# Patient Record
Sex: Female | Born: 1935
Health system: Southern US, Community
[De-identification: ages and names within clinical notes are randomized; demographics above are authoritative.]

## PROBLEM LIST (undated history)

## (undated) DIAGNOSIS — M1990 Unspecified osteoarthritis, unspecified site: Secondary | ICD-10-CM

## (undated) DIAGNOSIS — K219 Gastro-esophageal reflux disease without esophagitis: Secondary | ICD-10-CM

## (undated) HISTORY — PX: TONSILLECTOMY: SUR1361

## (undated) HISTORY — PX: OOPHORECTOMY: SHX86

## (undated) HISTORY — PX: CHOLECYSTECTOMY: SHX55

## (undated) HISTORY — PX: ABDOMINAL HYSTERECTOMY: SHX81

---

## 2002-10-21 ENCOUNTER — Other Ambulatory Visit: Admission: RE | Admit: 2002-10-21 | Discharge: 2002-10-21 | Payer: Self-pay | Admitting: Obstetrics & Gynecology

## 2004-11-18 ENCOUNTER — Ambulatory Visit: Payer: Self-pay

## 2004-12-06 ENCOUNTER — Other Ambulatory Visit: Admission: RE | Admit: 2004-12-06 | Discharge: 2004-12-06 | Payer: Self-pay | Admitting: Obstetrics & Gynecology

## 2004-12-07 ENCOUNTER — Ambulatory Visit: Payer: Self-pay

## 2004-12-14 ENCOUNTER — Encounter: Admission: RE | Admit: 2004-12-14 | Discharge: 2004-12-14 | Payer: Self-pay | Admitting: Obstetrics & Gynecology

## 2004-12-21 ENCOUNTER — Ambulatory Visit: Payer: Self-pay

## 2007-12-11 ENCOUNTER — Ambulatory Visit: Payer: Self-pay | Admitting: Internal Medicine

## 2007-12-25 ENCOUNTER — Other Ambulatory Visit: Payer: Self-pay

## 2007-12-25 ENCOUNTER — Ambulatory Visit: Payer: Self-pay | Admitting: Surgery

## 2008-01-07 ENCOUNTER — Ambulatory Visit: Payer: Self-pay | Admitting: Surgery

## 2008-08-28 ENCOUNTER — Ambulatory Visit: Payer: Self-pay | Admitting: Gastroenterology

## 2010-08-07 ENCOUNTER — Encounter: Payer: Self-pay | Admitting: Obstetrics & Gynecology

## 2011-03-28 ENCOUNTER — Other Ambulatory Visit: Payer: Self-pay | Admitting: Gastroenterology

## 2011-03-28 DIAGNOSIS — I498 Other specified cardiac arrhythmias: Secondary | ICD-10-CM

## 2011-06-27 ENCOUNTER — Ambulatory Visit: Payer: Self-pay | Admitting: Gastroenterology

## 2011-06-29 LAB — PATHOLOGY REPORT

## 2011-12-13 ENCOUNTER — Ambulatory Visit: Payer: Self-pay | Admitting: Internal Medicine

## 2012-01-03 ENCOUNTER — Ambulatory Visit: Payer: Self-pay | Admitting: Internal Medicine

## 2013-02-11 ENCOUNTER — Ambulatory Visit: Payer: Self-pay | Admitting: Internal Medicine

## 2013-02-13 ENCOUNTER — Ambulatory Visit: Payer: Self-pay | Admitting: Internal Medicine

## 2014-02-16 ENCOUNTER — Ambulatory Visit: Payer: Self-pay | Admitting: Family Medicine

## 2014-10-16 DIAGNOSIS — H269 Unspecified cataract: Secondary | ICD-10-CM | POA: Diagnosis not present

## 2014-11-06 DIAGNOSIS — E782 Mixed hyperlipidemia: Secondary | ICD-10-CM | POA: Diagnosis not present

## 2014-11-06 DIAGNOSIS — E039 Hypothyroidism, unspecified: Secondary | ICD-10-CM | POA: Diagnosis not present

## 2014-11-06 DIAGNOSIS — Z79899 Other long term (current) drug therapy: Secondary | ICD-10-CM | POA: Diagnosis not present

## 2014-11-06 DIAGNOSIS — R7301 Impaired fasting glucose: Secondary | ICD-10-CM | POA: Diagnosis not present

## 2014-11-16 DIAGNOSIS — E039 Hypothyroidism, unspecified: Secondary | ICD-10-CM | POA: Diagnosis not present

## 2014-11-16 DIAGNOSIS — R739 Hyperglycemia, unspecified: Secondary | ICD-10-CM | POA: Diagnosis not present

## 2014-11-16 DIAGNOSIS — Z0000 Encounter for general adult medical examination without abnormal findings: Secondary | ICD-10-CM | POA: Diagnosis not present

## 2015-02-02 DIAGNOSIS — H6501 Acute serous otitis media, right ear: Secondary | ICD-10-CM | POA: Diagnosis not present

## 2015-02-17 ENCOUNTER — Other Ambulatory Visit: Payer: Self-pay | Admitting: Family Medicine

## 2015-02-17 DIAGNOSIS — Z1231 Encounter for screening mammogram for malignant neoplasm of breast: Secondary | ICD-10-CM

## 2015-02-25 DIAGNOSIS — H6980 Other specified disorders of Eustachian tube, unspecified ear: Secondary | ICD-10-CM | POA: Diagnosis not present

## 2015-02-25 DIAGNOSIS — H903 Sensorineural hearing loss, bilateral: Secondary | ICD-10-CM | POA: Diagnosis not present

## 2015-02-25 DIAGNOSIS — H9201 Otalgia, right ear: Secondary | ICD-10-CM | POA: Diagnosis not present

## 2015-03-18 ENCOUNTER — Other Ambulatory Visit: Payer: Self-pay | Admitting: Family Medicine

## 2015-03-18 ENCOUNTER — Ambulatory Visit
Admission: RE | Admit: 2015-03-18 | Discharge: 2015-03-18 | Disposition: A | Discharge status: Home / Self Care | Payer: Commercial Managed Care - HMO | Source: Ambulatory Visit | Attending: Family Medicine | Admitting: Family Medicine

## 2015-03-18 DIAGNOSIS — Z1231 Encounter for screening mammogram for malignant neoplasm of breast: Secondary | ICD-10-CM | POA: Insufficient documentation

## 2015-04-20 DIAGNOSIS — J069 Acute upper respiratory infection, unspecified: Secondary | ICD-10-CM | POA: Diagnosis not present

## 2015-05-10 DIAGNOSIS — E781 Pure hyperglyceridemia: Secondary | ICD-10-CM | POA: Diagnosis not present

## 2015-05-10 DIAGNOSIS — Z79899 Other long term (current) drug therapy: Secondary | ICD-10-CM | POA: Diagnosis not present

## 2015-05-10 DIAGNOSIS — R739 Hyperglycemia, unspecified: Secondary | ICD-10-CM | POA: Diagnosis not present

## 2015-05-10 DIAGNOSIS — E038 Other specified hypothyroidism: Secondary | ICD-10-CM | POA: Diagnosis not present

## 2015-05-20 DIAGNOSIS — Z79899 Other long term (current) drug therapy: Secondary | ICD-10-CM | POA: Diagnosis not present

## 2015-05-20 DIAGNOSIS — E039 Hypothyroidism, unspecified: Secondary | ICD-10-CM | POA: Diagnosis not present

## 2015-05-20 DIAGNOSIS — I10 Essential (primary) hypertension: Secondary | ICD-10-CM | POA: Diagnosis not present

## 2015-05-20 DIAGNOSIS — R739 Hyperglycemia, unspecified: Secondary | ICD-10-CM | POA: Diagnosis not present

## 2015-05-20 DIAGNOSIS — K219 Gastro-esophageal reflux disease without esophagitis: Secondary | ICD-10-CM | POA: Diagnosis not present

## 2015-05-20 DIAGNOSIS — E782 Mixed hyperlipidemia: Secondary | ICD-10-CM | POA: Diagnosis not present

## 2015-06-02 DIAGNOSIS — L570 Actinic keratosis: Secondary | ICD-10-CM | POA: Diagnosis not present

## 2015-06-02 DIAGNOSIS — L239 Allergic contact dermatitis, unspecified cause: Secondary | ICD-10-CM | POA: Diagnosis not present

## 2015-06-16 DIAGNOSIS — L232 Allergic contact dermatitis due to cosmetics: Secondary | ICD-10-CM | POA: Diagnosis not present

## 2015-09-14 DIAGNOSIS — M792 Neuralgia and neuritis, unspecified: Secondary | ICD-10-CM | POA: Diagnosis not present

## 2015-12-02 DIAGNOSIS — K045 Chronic apical periodontitis: Secondary | ICD-10-CM | POA: Diagnosis not present

## 2015-12-02 DIAGNOSIS — J338 Other polyp of sinus: Secondary | ICD-10-CM | POA: Diagnosis not present

## 2015-12-14 DIAGNOSIS — B9689 Other specified bacterial agents as the cause of diseases classified elsewhere: Secondary | ICD-10-CM | POA: Diagnosis not present

## 2015-12-14 DIAGNOSIS — J019 Acute sinusitis, unspecified: Secondary | ICD-10-CM | POA: Diagnosis not present

## 2015-12-16 DIAGNOSIS — Z79899 Other long term (current) drug therapy: Secondary | ICD-10-CM | POA: Diagnosis not present

## 2015-12-16 DIAGNOSIS — R739 Hyperglycemia, unspecified: Secondary | ICD-10-CM | POA: Diagnosis not present

## 2015-12-16 DIAGNOSIS — E039 Hypothyroidism, unspecified: Secondary | ICD-10-CM | POA: Diagnosis not present

## 2015-12-16 DIAGNOSIS — E782 Mixed hyperlipidemia: Secondary | ICD-10-CM | POA: Diagnosis not present

## 2015-12-23 DIAGNOSIS — E039 Hypothyroidism, unspecified: Secondary | ICD-10-CM | POA: Diagnosis not present

## 2015-12-23 DIAGNOSIS — Z79899 Other long term (current) drug therapy: Secondary | ICD-10-CM | POA: Diagnosis not present

## 2015-12-23 DIAGNOSIS — Z0000 Encounter for general adult medical examination without abnormal findings: Secondary | ICD-10-CM | POA: Diagnosis not present

## 2015-12-23 DIAGNOSIS — R739 Hyperglycemia, unspecified: Secondary | ICD-10-CM | POA: Diagnosis not present

## 2015-12-23 DIAGNOSIS — I10 Essential (primary) hypertension: Secondary | ICD-10-CM | POA: Diagnosis not present

## 2016-02-07 DIAGNOSIS — H2513 Age-related nuclear cataract, bilateral: Secondary | ICD-10-CM | POA: Diagnosis not present

## 2016-02-10 ENCOUNTER — Other Ambulatory Visit: Payer: Self-pay | Admitting: Family Medicine

## 2016-02-10 DIAGNOSIS — Z1231 Encounter for screening mammogram for malignant neoplasm of breast: Secondary | ICD-10-CM

## 2016-03-21 ENCOUNTER — Ambulatory Visit
Admission: RE | Admit: 2016-03-21 | Discharge: 2016-03-21 | Disposition: A | Discharge status: Home / Self Care | Payer: Commercial Managed Care - HMO | Source: Ambulatory Visit | Attending: Family Medicine | Admitting: Family Medicine

## 2016-03-21 ENCOUNTER — Other Ambulatory Visit: Payer: Self-pay | Admitting: Family Medicine

## 2016-03-21 DIAGNOSIS — Z1231 Encounter for screening mammogram for malignant neoplasm of breast: Secondary | ICD-10-CM | POA: Diagnosis not present

## 2016-03-31 DIAGNOSIS — J019 Acute sinusitis, unspecified: Secondary | ICD-10-CM | POA: Diagnosis not present

## 2016-04-06 DIAGNOSIS — L040 Acute lymphadenitis of face, head and neck: Secondary | ICD-10-CM | POA: Diagnosis not present

## 2016-06-16 DIAGNOSIS — E039 Hypothyroidism, unspecified: Secondary | ICD-10-CM | POA: Diagnosis not present

## 2016-06-16 DIAGNOSIS — Z79899 Other long term (current) drug therapy: Secondary | ICD-10-CM | POA: Diagnosis not present

## 2016-06-16 DIAGNOSIS — R739 Hyperglycemia, unspecified: Secondary | ICD-10-CM | POA: Diagnosis not present

## 2016-06-16 DIAGNOSIS — I10 Essential (primary) hypertension: Secondary | ICD-10-CM | POA: Diagnosis not present

## 2016-06-23 DIAGNOSIS — G8929 Other chronic pain: Secondary | ICD-10-CM | POA: Diagnosis not present

## 2016-06-23 DIAGNOSIS — I10 Essential (primary) hypertension: Secondary | ICD-10-CM | POA: Diagnosis not present

## 2016-06-23 DIAGNOSIS — Z79899 Other long term (current) drug therapy: Secondary | ICD-10-CM | POA: Diagnosis not present

## 2016-06-23 DIAGNOSIS — E7800 Pure hypercholesterolemia, unspecified: Secondary | ICD-10-CM | POA: Diagnosis not present

## 2016-06-23 DIAGNOSIS — M25562 Pain in left knee: Secondary | ICD-10-CM | POA: Diagnosis not present

## 2016-06-23 DIAGNOSIS — R739 Hyperglycemia, unspecified: Secondary | ICD-10-CM | POA: Diagnosis not present

## 2016-06-23 DIAGNOSIS — E039 Hypothyroidism, unspecified: Secondary | ICD-10-CM | POA: Diagnosis not present

## 2016-12-26 DIAGNOSIS — E7800 Pure hypercholesterolemia, unspecified: Secondary | ICD-10-CM | POA: Diagnosis not present

## 2016-12-26 DIAGNOSIS — Z79899 Other long term (current) drug therapy: Secondary | ICD-10-CM | POA: Diagnosis not present

## 2016-12-26 DIAGNOSIS — R739 Hyperglycemia, unspecified: Secondary | ICD-10-CM | POA: Diagnosis not present

## 2016-12-26 DIAGNOSIS — E039 Hypothyroidism, unspecified: Secondary | ICD-10-CM | POA: Diagnosis not present

## 2017-01-02 DIAGNOSIS — Z0000 Encounter for general adult medical examination without abnormal findings: Secondary | ICD-10-CM | POA: Diagnosis not present

## 2017-01-02 DIAGNOSIS — Z79899 Other long term (current) drug therapy: Secondary | ICD-10-CM | POA: Diagnosis not present

## 2017-01-02 DIAGNOSIS — R739 Hyperglycemia, unspecified: Secondary | ICD-10-CM | POA: Diagnosis not present

## 2017-01-02 DIAGNOSIS — E7800 Pure hypercholesterolemia, unspecified: Secondary | ICD-10-CM | POA: Diagnosis not present

## 2017-02-12 ENCOUNTER — Other Ambulatory Visit: Payer: Self-pay | Admitting: Family Medicine

## 2017-02-12 DIAGNOSIS — Z1231 Encounter for screening mammogram for malignant neoplasm of breast: Secondary | ICD-10-CM

## 2017-03-12 DIAGNOSIS — H2513 Age-related nuclear cataract, bilateral: Secondary | ICD-10-CM | POA: Diagnosis not present

## 2017-03-29 ENCOUNTER — Ambulatory Visit
Admission: RE | Admit: 2017-03-29 | Discharge: 2017-03-29 | Disposition: A | Discharge status: Home / Self Care | Payer: Commercial Managed Care - HMO | Source: Ambulatory Visit | Attending: Family Medicine | Admitting: Family Medicine

## 2017-03-29 DIAGNOSIS — Z1231 Encounter for screening mammogram for malignant neoplasm of breast: Secondary | ICD-10-CM | POA: Diagnosis not present

## 2017-06-12 DIAGNOSIS — H903 Sensorineural hearing loss, bilateral: Secondary | ICD-10-CM | POA: Diagnosis not present

## 2017-06-12 DIAGNOSIS — H6122 Impacted cerumen, left ear: Secondary | ICD-10-CM | POA: Diagnosis not present

## 2017-06-28 DIAGNOSIS — E7800 Pure hypercholesterolemia, unspecified: Secondary | ICD-10-CM | POA: Diagnosis not present

## 2017-06-28 DIAGNOSIS — Z79899 Other long term (current) drug therapy: Secondary | ICD-10-CM | POA: Diagnosis not present

## 2017-06-28 DIAGNOSIS — E039 Hypothyroidism, unspecified: Secondary | ICD-10-CM | POA: Diagnosis not present

## 2017-06-28 DIAGNOSIS — R739 Hyperglycemia, unspecified: Secondary | ICD-10-CM | POA: Diagnosis not present

## 2017-07-16 DIAGNOSIS — E7800 Pure hypercholesterolemia, unspecified: Secondary | ICD-10-CM | POA: Diagnosis not present

## 2017-07-16 DIAGNOSIS — E039 Hypothyroidism, unspecified: Secondary | ICD-10-CM | POA: Diagnosis not present

## 2017-07-16 DIAGNOSIS — R739 Hyperglycemia, unspecified: Secondary | ICD-10-CM | POA: Diagnosis not present

## 2017-07-16 DIAGNOSIS — Z79899 Other long term (current) drug therapy: Secondary | ICD-10-CM | POA: Diagnosis not present

## 2017-11-22 DIAGNOSIS — L0201 Cutaneous abscess of face: Secondary | ICD-10-CM | POA: Diagnosis not present

## 2017-12-27 DIAGNOSIS — L989 Disorder of the skin and subcutaneous tissue, unspecified: Secondary | ICD-10-CM | POA: Diagnosis not present

## 2017-12-31 DIAGNOSIS — L820 Inflamed seborrheic keratosis: Secondary | ICD-10-CM | POA: Diagnosis not present

## 2017-12-31 DIAGNOSIS — L821 Other seborrheic keratosis: Secondary | ICD-10-CM | POA: Diagnosis not present

## 2017-12-31 DIAGNOSIS — L578 Other skin changes due to chronic exposure to nonionizing radiation: Secondary | ICD-10-CM | POA: Diagnosis not present

## 2018-01-10 DIAGNOSIS — E7800 Pure hypercholesterolemia, unspecified: Secondary | ICD-10-CM | POA: Diagnosis not present

## 2018-01-10 DIAGNOSIS — E039 Hypothyroidism, unspecified: Secondary | ICD-10-CM | POA: Diagnosis not present

## 2018-01-10 DIAGNOSIS — Z79899 Other long term (current) drug therapy: Secondary | ICD-10-CM | POA: Diagnosis not present

## 2018-01-10 DIAGNOSIS — R739 Hyperglycemia, unspecified: Secondary | ICD-10-CM | POA: Diagnosis not present

## 2018-01-16 DIAGNOSIS — M25562 Pain in left knee: Secondary | ICD-10-CM | POA: Diagnosis not present

## 2018-01-29 DIAGNOSIS — Z79899 Other long term (current) drug therapy: Secondary | ICD-10-CM | POA: Diagnosis not present

## 2018-01-29 DIAGNOSIS — E039 Hypothyroidism, unspecified: Secondary | ICD-10-CM | POA: Diagnosis not present

## 2018-01-29 DIAGNOSIS — E782 Mixed hyperlipidemia: Secondary | ICD-10-CM | POA: Diagnosis not present

## 2018-01-29 DIAGNOSIS — R739 Hyperglycemia, unspecified: Secondary | ICD-10-CM | POA: Diagnosis not present

## 2018-01-29 DIAGNOSIS — Z0000 Encounter for general adult medical examination without abnormal findings: Secondary | ICD-10-CM | POA: Diagnosis not present

## 2018-02-07 DIAGNOSIS — M25562 Pain in left knee: Secondary | ICD-10-CM | POA: Diagnosis not present

## 2018-02-07 DIAGNOSIS — M1712 Unilateral primary osteoarthritis, left knee: Secondary | ICD-10-CM | POA: Diagnosis not present

## 2018-02-11 ENCOUNTER — Emergency Department
Admission: EM | Admit: 2018-02-11 | Discharge: 2018-02-11 | Disposition: A | Discharge status: Home / Self Care | Payer: Medicare HMO | Attending: Student in an Organized Health Care Education/Training Program | Admitting: Student in an Organized Health Care Education/Training Program

## 2018-02-11 ENCOUNTER — Emergency Department: Payer: Medicare HMO

## 2018-02-11 ENCOUNTER — Other Ambulatory Visit: Payer: Self-pay

## 2018-02-11 DIAGNOSIS — R0789 Other chest pain: Secondary | ICD-10-CM | POA: Insufficient documentation

## 2018-02-11 DIAGNOSIS — R072 Precordial pain: Secondary | ICD-10-CM | POA: Diagnosis not present

## 2018-02-11 DIAGNOSIS — R079 Chest pain, unspecified: Secondary | ICD-10-CM | POA: Diagnosis not present

## 2018-02-11 DIAGNOSIS — I700 Atherosclerosis of aorta: Secondary | ICD-10-CM | POA: Diagnosis not present

## 2018-02-11 LAB — TROPONIN I
Troponin I: <0.03 ng/mL (ref <0.03)
Troponin I: 0.03 ng/mL (ref <0.03)

## 2018-02-11 LAB — CBC WITH DIFFERENTIAL/PLATELET
BASOS ABS: 0.1 10*3/uL (ref 0–0.1)
Basophils Relative: 1 %
EOS PCT: 3 %
Eosinophils Absolute: 0.3 10*3/uL (ref 0–0.7)
HEMATOCRIT: 47.2 % — AB (ref 35.0–47.0)
Hemoglobin: 15.8 g/dL (ref 12.0–16.0)
LYMPHS PCT: 29 %
Lymphs Abs: 3.7 10*3/uL — ABNORMAL HIGH (ref 1.0–3.6)
MCH: 28.2 pg (ref 26.0–34.0)
MCHC: 33.5 g/dL (ref 32.0–36.0)
MCV: 84 fL (ref 80.0–100.0)
MONO ABS: 1 10*3/uL — AB (ref 0.2–0.9)
MONOS PCT: 8 %
NEUTROS ABS: 7.9 10*3/uL — AB (ref 1.4–6.5)
Neutrophils Relative %: 59 %
PLATELETS: 210 10*3/uL (ref 150–440)
RBC: 5.61 MIL/uL — ABNORMAL HIGH (ref 3.80–5.20)
RDW: 14.4 % (ref 11.5–14.5)
WBC: 13.1 10*3/uL — ABNORMAL HIGH (ref 3.6–11.0)

## 2018-02-11 LAB — COMPREHENSIVE METABOLIC PANEL
ALT: 32 U/L (ref 0–44)
ANION GAP: 6 (ref 5–15)
AST: 24 U/L (ref 15–41)
Albumin: 4.2 g/dL (ref 3.5–5.0)
Alkaline Phosphatase: 47 U/L (ref 38–126)
BUN: 23 mg/dL (ref 8–23)
CHLORIDE: 104 mmol/L (ref 98–111)
CO2: 31 mmol/L (ref 22–32)
Calcium: 9.4 mg/dL (ref 8.9–10.3)
Creatinine, Ser: 0.91 mg/dL (ref 0.44–1.00)
GFR calc non Af Amer: 57 mL/min — ABNORMAL LOW (ref >60)
GLUCOSE: 132 mg/dL — AB (ref 70–99)
POTASSIUM: 5.2 mmol/L — AB (ref 3.5–5.1)
Sodium: 141 mmol/L (ref 135–145)
Total Bilirubin: 0.3 mg/dL (ref 0.3–1.2)
Total Protein: 7.1 g/dL (ref 6.5–8.1)

## 2018-02-11 MED ORDER — GI COCKTAIL ~~LOC~~
30.0000 mL | Freq: Once | ORAL | Status: AC
  Administered 2018-02-11: 30 mL via ORAL
  Filled 2018-02-11: qty 30

## 2018-02-11 MED ORDER — AMLODIPINE BESYLATE 5 MG PO TABS
5.0000 mg | ORAL_TABLET | Freq: Once | ORAL | Status: AC
  Administered 2018-02-11: 5 mg via ORAL
  Filled 2018-02-11: qty 1

## 2018-02-11 MED ORDER — IOPAMIDOL (ISOVUE-370) INJECTION 76%
75.0000 mL | Freq: Once | INTRAVENOUS | Status: AC | PRN
  Administered 2018-02-11: 75 mL via INTRAVENOUS

## 2018-02-11 NOTE — Discharge Instructions (Addendum)

## 2018-02-11 NOTE — ED Triage Notes (Signed)
Pt arrives for chest pain started around 1 hour pt states pain stopped after asprin   220/100 87  97 RA NKA

## 2018-02-11 NOTE — ED Provider Notes (Signed)
-----------------------------------------   6:22 PM on 02/11/2018 -----------------------------------------   Blood pressure 135/75, pulse 67, temperature 98.3 F (36.8 C), temperature source Oral, resp. rate 16, height 5\' 2"  (1.575 m), weight 69.9 kg (154 lb), SpO2 97 %.  Assuming care from Dr. Quentin Cornwall of [facility] is a 84 y.o. female with a chief complaint of Chest Pain .    Please refer to H&P by previous MD for further details.  The current plan of care is to f/u results of repeat troponin and CTA.   I have personally reviewed the images performed during this visit and I agree with the Radiologist's read.   Interpretation by Radiologist:  Dg Chest Portable 1 View  Result Date: 02/11/2018 CLINICAL DATA:  Chest pain EXAM: PORTABLE CHEST 1 VIEW COMPARISON:  12/25/2007 FINDINGS: Normal heart size and mediastinal contours. There is no edema, consolidation, effusion, or pneumothorax. Artifact from EKG leads. IMPRESSION: Negative portable chest. Electronically Signed   By: Monte Fantasia M.D.   On: 02/11/2018 14:03   Ct Angio Chest Aorta W And/or Wo Contrast  Result Date: 02/11/2018 CLINICAL DATA:  Chest pain. EXAM: CT ANGIOGRAPHY CHEST WITH CONTRAST TECHNIQUE: Multidetector CT imaging of the chest was performed using the standard protocol during bolus administration of intravenous contrast. Multiplanar CT image reconstructions and MIPs were obtained to evaluate the vascular anatomy. CONTRAST:  37mL ISOVUE-370 IOPAMIDOL (ISOVUE-370) INJECTION 76% COMPARISON:  Radiograph of same day. FINDINGS: Cardiovascular: Atherosclerosis of thoracic aorta is noted without aneurysm or dissection. Great vessels are widely patent without significant stenosis. Normal cardiac size. No pericardial effusion. Mediastinum/Nodes: Small sliding-type hiatal hernia. No adenopathy is noted. Lungs/Pleura: No pneumothorax or pleural effusion is noted. Minimal bibasilar subsegmental atelectasis or scarring is noted.  Upper Abdomen: No acute abnormality. Musculoskeletal: No chest wall abnormality. No acute or significant osseous findings. Review of the MIP images confirms the above findings. IMPRESSION: No evidence of thoracic aortic aneurysm or dissection. Small sliding-type hiatal hernia. Minimal bibasilar subsegmental atelectasis or scarring is noted. Aortic Atherosclerosis (ICD10-I70.0). Electronically Signed   By: Marijo Conception, M.D.   On: 02/11/2018 15:49     Repeat troponin q4hrs with no significant elevation. Patient remains pain free. CT negative for any acute findings.  Recommend close follow-up with primary care doctor and return to the emergency room for new or worsening chest pain, shortness of breath, or dizziness.        Alfred Levins, [place], MD 02/11/18 [phone]

## 2018-02-11 NOTE — ED Notes (Signed)
Date and time results received: 02/11/18 6:02 PM  Test: Troponin I  Critical Value: 0.03  Name of Provider Notified: Dr. Alfred Levins  Orders Received? Or Actions Taken?: no new orders at this time

## 2018-02-11 NOTE — ED Provider Notes (Signed)
[facility] Emergency Department Provider Note    First MD Initiated Contact with Patient 02/11/18 1338     (approximate)  I have reviewed the triage vital signs and the nursing notes.   HISTORY  Chief Complaint Chest Pain    HPI Paula Joyce is a 84 y.o. female presents the ER with chief complaint of midsternal chest pain radiating bilateral neck that started around 1230 and woke her from sleep while she was laying down after eating dinner.  States the symptoms lasted roughly 30 minutes and improved after she took an aspirin.  Denies any shortness of breath or diaphoresis.  No pain ripping or tearing through to her back.  Denies any nausea or vomiting.  Does have significant history of reflux.  Not currently taking any antiacid medication is not currently on any antihypertensive medications.  States that she is asymptomatic and completely fine at this point.  Denies any numbness or tingling.    History reviewed. No pertinent past medical history. Family History  Problem Relation Age of Onset  . Breast cancer Neg Hx    No past surgical history on file. There are no active problems to display for this patient.     Prior to Admission medications   Not on File    Allergies Patient has no known allergies.    Social History Social History   Tobacco Use  . Smoking status: Not on file  Substance Use Topics  . Alcohol use: Not on file  . Drug use: Not on file    Review of Systems Patient denies headaches, rhinorrhea, blurry vision, numbness, shortness of breath, chest pain, edema, cough, abdominal pain, nausea, vomiting, diarrhea, dysuria, fevers, rashes or hallucinations unless otherwise stated above in HPI. ____________________________________________   PHYSICAL EXAM:  VITAL SIGNS: Vitals:   02/11/18 1400 02/11/18 1500  BP: (!) 202/90 (!) 164/74  Pulse: 79 76  Resp: 19 16  Temp:    SpO2: 100% 95%    Constitutional: Alert and  oriented.  Eyes: Conjunctivae are normal.  Head: Atraumatic. Nose: No congestion/rhinnorhea. Mouth/Throat: Mucous membranes are moist.   Neck: No stridor. Painless ROM.  Cardiovascular: Normal rate, regular rhythm. Grossly normal heart sounds.  Good peripheral circulation. Respiratory: Normal respiratory effort.  No retractions. Lungs CTAB. Gastrointestinal: Soft and nontender. No distention. No abdominal bruits. No CVA tenderness. Genitourinary:  Musculoskeletal: No lower extremity tenderness nor edema.  No joint effusions. Neurologic:  Normal speech and language. No gross focal neurologic deficits are appreciated. No facial droop Skin:  Skin is warm, dry and intact. No rash noted. Psychiatric: Mood and affect are normal. Speech and behavior are normal.  ____________________________________________   LABS (all labs ordered are listed, but only abnormal results are displayed)  Results for orders placed or performed during the hospital encounter of 02/11/18 (from the past 24 hour(s))  CBC with Differential/Platelet     Status: Abnormal   Collection Time: 02/11/18  1:40 PM  Result Value Ref Range   WBC 13.1 (H) 3.6 - 11.0 K/uL   RBC 5.61 (H) 3.80 - 5.20 MIL/uL   Hemoglobin 15.8 12.0 - 16.0 g/dL   HCT 47.2 (H) 35.0 - 47.0 %   MCV 84.0 80.0 - 100.0 fL   MCH 28.2 26.0 - 34.0 pg   MCHC 33.5 32.0 - 36.0 g/dL   RDW 14.4 11.5 - 14.5 %   Platelets 210 150 - 440 K/uL   Neutrophils Relative % 59 %   Neutro Abs 7.9 (  H) 1.4 - 6.5 K/uL   Lymphocytes Relative 29 %   Lymphs Abs 3.7 (H) 1.0 - 3.6 K/uL   Monocytes Relative 8 %   Monocytes Absolute 1.0 (H) 0.2 - 0.9 K/uL   Eosinophils Relative 3 %   Eosinophils Absolute 0.3 0 - 0.7 K/uL   Basophils Relative 1 %   Basophils Absolute 0.1 0 - 0.1 K/uL  Troponin I     Status: None   Collection Time: 02/11/18  1:40 PM  Result Value Ref Range   Troponin I <0.03 <0.03 ng/mL  Comprehensive metabolic panel     Status: Abnormal   Collection Time:  02/11/18  1:40 PM  Result Value Ref Range   Sodium 141 135 - 145 mmol/L   Potassium 5.2 (H) 3.5 - 5.1 mmol/L   Chloride 104 98 - 111 mmol/L   CO2 31 22 - 32 mmol/L   Glucose, Bld 132 (H) 70 - 99 mg/dL   BUN 23 8 - 23 mg/dL   Creatinine, Ser 0.91 0.44 - 1.00 mg/dL   Calcium 9.4 8.9 - 10.3 mg/dL   Total Protein 7.1 6.5 - 8.1 g/dL   Albumin 4.2 3.5 - 5.0 g/dL   AST 24 15 - 41 U/L   ALT 32 0 - 44 U/L   Alkaline Phosphatase 47 38 - 126 U/L   Total Bilirubin 0.3 0.3 - 1.2 mg/dL   GFR calc non Af Amer 57 (L) >60 mL/min   GFR calc Af Amer >60 >60 mL/min   Anion gap 6 5 - 15   ____________________________________________  EKG My review and personal interpretation at Time: 13:41   Indication: chest pain  Rate: 75  Rhythm: sinus Axis: normal Other: normal intervals, no stemi ____________________________________________  RADIOLOGY  I personally reviewed all radiographic images ordered to evaluate for the above acute complaints and reviewed radiology reports and findings.  These findings were personally discussed with the patient.  Please see medical record for radiology report.  ____________________________________________   PROCEDURES  Procedure(s) performed:  Procedures    Critical Care performed: no ____________________________________________   INITIAL IMPRESSION / ASSESSMENT AND PLAN / ED COURSE  Pertinent labs & imaging results that were available during my care of the patient were reviewed by me and considered in my medical decision making (see chart for details).   DDX: ACS, pericarditis, esophagitis, boerhaaves, pe, dissection, pna, bronchitis, costochondritis   Paula Joyce is a 84 y.o. who presents to the ED with symptoms as described above.  Patient arrives to ER pain-free but is hypertensive.  EKG shows no evidence of acute ischemia and her initial troponin is negative.  Given the broad differential CT imaging also ordered to exclude dissection aneurysm  causing the patient's pain.  Patient does endorse quite a bit of stress and does have a history of reflux may be having esophageal spasm but as her pain is controlled at this point will give dose of Norvasc and observe for repeat troponin.  Patient be signed out to oncoming physician pending repeat troponin.      As part of my medical decision making, I reviewed the following data within the [place] notes reviewed and incorporated, Labs reviewed, notes from prior ED visits.  ____________________________________________   FINAL CLINICAL IMPRESSION(S) / ED DIAGNOSES  Final diagnoses:  Atypical chest pain      NEW MEDICATIONS STARTED DURING THIS VISIT:  New Prescriptions   No medications on file     Note:  This  document was prepared using Systems analyst and may include unintentional dictation errors.    Merlyn Lot, MD 02/11/18 [phone]

## 2018-02-11 NOTE — ED Notes (Signed)

## 2018-02-13 DIAGNOSIS — I10 Essential (primary) hypertension: Secondary | ICD-10-CM | POA: Diagnosis not present

## 2018-02-13 DIAGNOSIS — R0789 Other chest pain: Secondary | ICD-10-CM | POA: Diagnosis not present

## 2018-02-13 DIAGNOSIS — E782 Mixed hyperlipidemia: Secondary | ICD-10-CM | POA: Diagnosis not present

## 2018-02-20 DIAGNOSIS — R0789 Other chest pain: Secondary | ICD-10-CM | POA: Diagnosis not present

## 2018-03-05 ENCOUNTER — Other Ambulatory Visit: Payer: Self-pay | Admitting: Family Medicine

## 2018-03-05 DIAGNOSIS — Z1231 Encounter for screening mammogram for malignant neoplasm of breast: Secondary | ICD-10-CM

## 2018-03-11 DIAGNOSIS — L578 Other skin changes due to chronic exposure to nonionizing radiation: Secondary | ICD-10-CM | POA: Diagnosis not present

## 2018-03-11 DIAGNOSIS — L259 Unspecified contact dermatitis, unspecified cause: Secondary | ICD-10-CM | POA: Diagnosis not present

## 2018-03-11 DIAGNOSIS — L820 Inflamed seborrheic keratosis: Secondary | ICD-10-CM | POA: Diagnosis not present

## 2018-03-28 DIAGNOSIS — E7800 Pure hypercholesterolemia, unspecified: Secondary | ICD-10-CM | POA: Diagnosis not present

## 2018-03-28 DIAGNOSIS — I10 Essential (primary) hypertension: Secondary | ICD-10-CM | POA: Diagnosis not present

## 2018-03-28 DIAGNOSIS — I700 Atherosclerosis of aorta: Secondary | ICD-10-CM | POA: Diagnosis not present

## 2018-03-28 DIAGNOSIS — R0789 Other chest pain: Secondary | ICD-10-CM | POA: Diagnosis not present

## 2018-04-03 ENCOUNTER — Ambulatory Visit
Admission: RE | Admit: 2018-04-03 | Discharge: 2018-04-03 | Disposition: A | Discharge status: Home / Self Care | Payer: Medicare HMO | Source: Ambulatory Visit | Attending: Family Medicine | Admitting: Family Medicine

## 2018-04-03 DIAGNOSIS — Z1231 Encounter for screening mammogram for malignant neoplasm of breast: Secondary | ICD-10-CM | POA: Diagnosis not present

## 2018-10-02 ENCOUNTER — Ambulatory Visit: Payer: Self-pay | Admitting: Podiatry

## 2018-10-02 ENCOUNTER — Encounter

## 2019-02-13 IMAGING — MG MM DIGITAL SCREENING BILAT W/ TOMO W/ CAD
6 of 10 series · 6 of 30 positions shown · non-contrast
Comparison: Previous exam(s).

CLINICAL DATA: Screening.

EXAM:
DIGITAL SCREENING BILATERAL MAMMOGRAM WITH TOMO AND CAD

[L CC synth-2D]
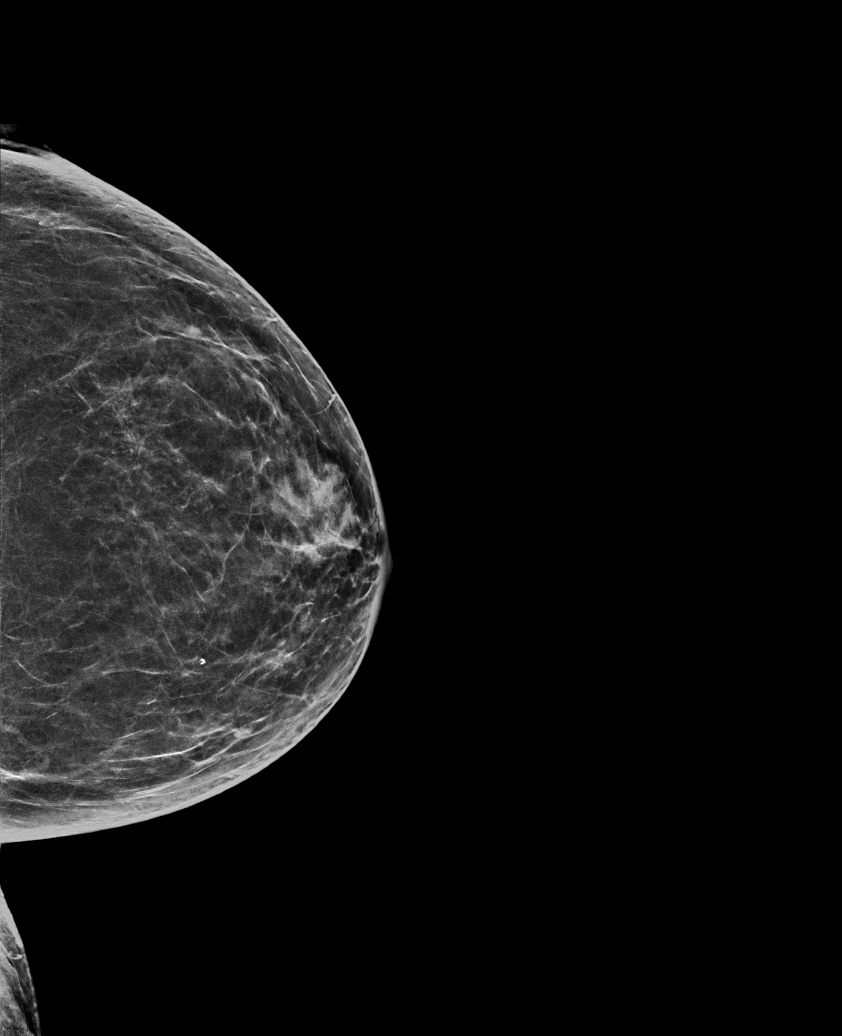

[L MLO synth-2D (1 of 2)]
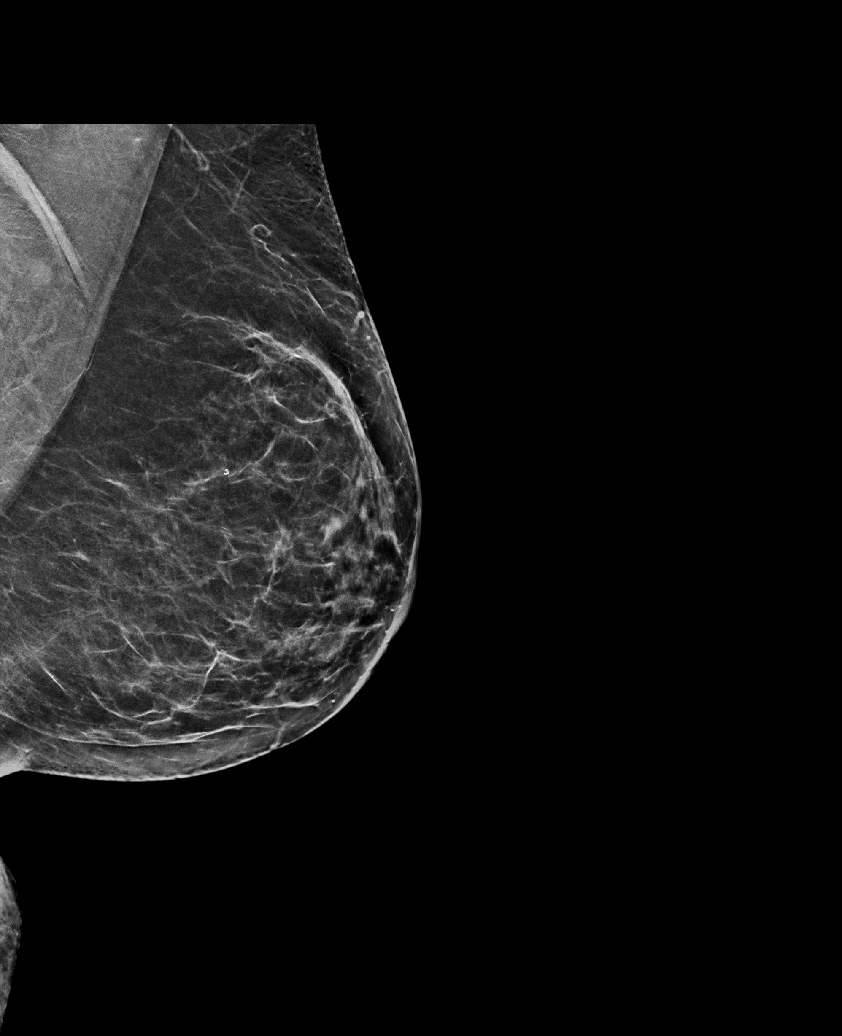

[R CC synth-2D]
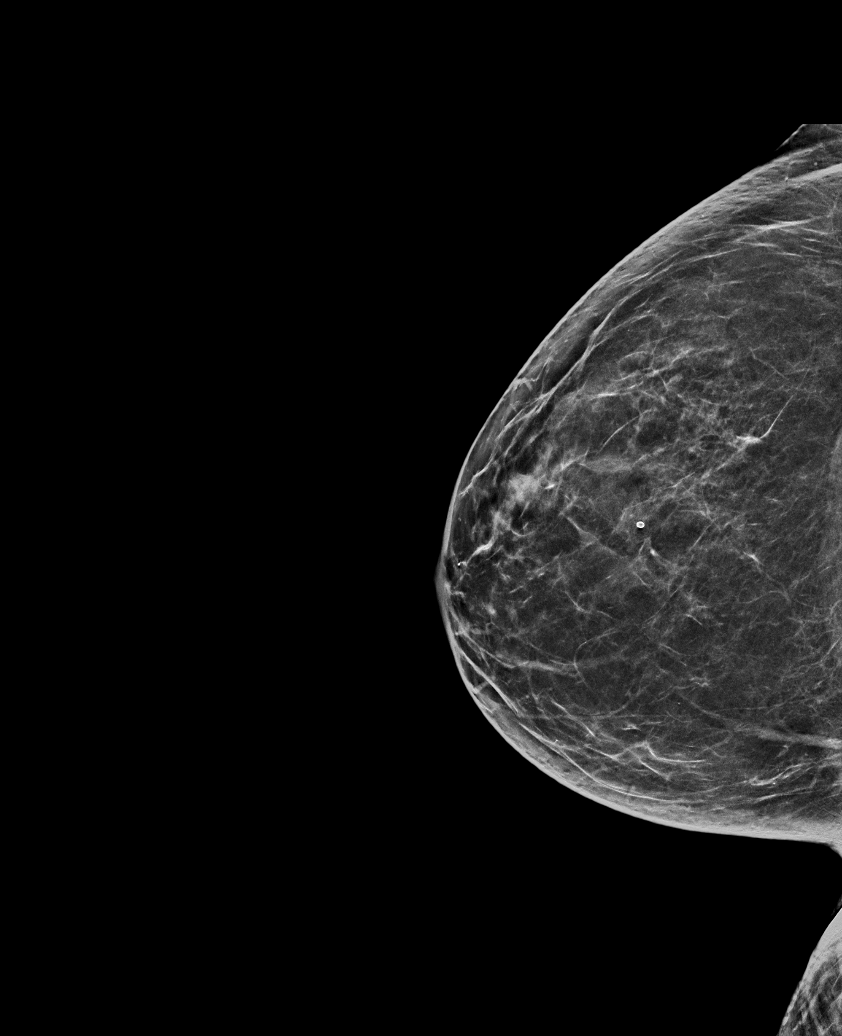

[L MLO synth-2D (2 of 2)]
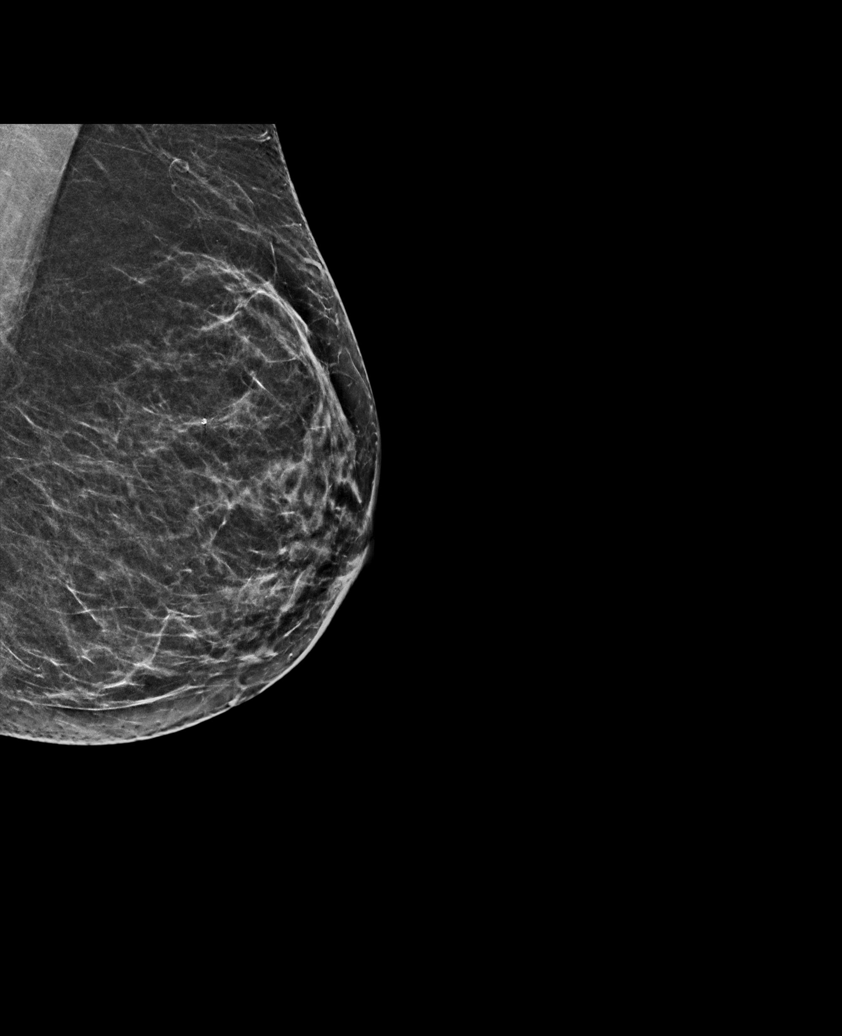

[R MLO synth-2D]
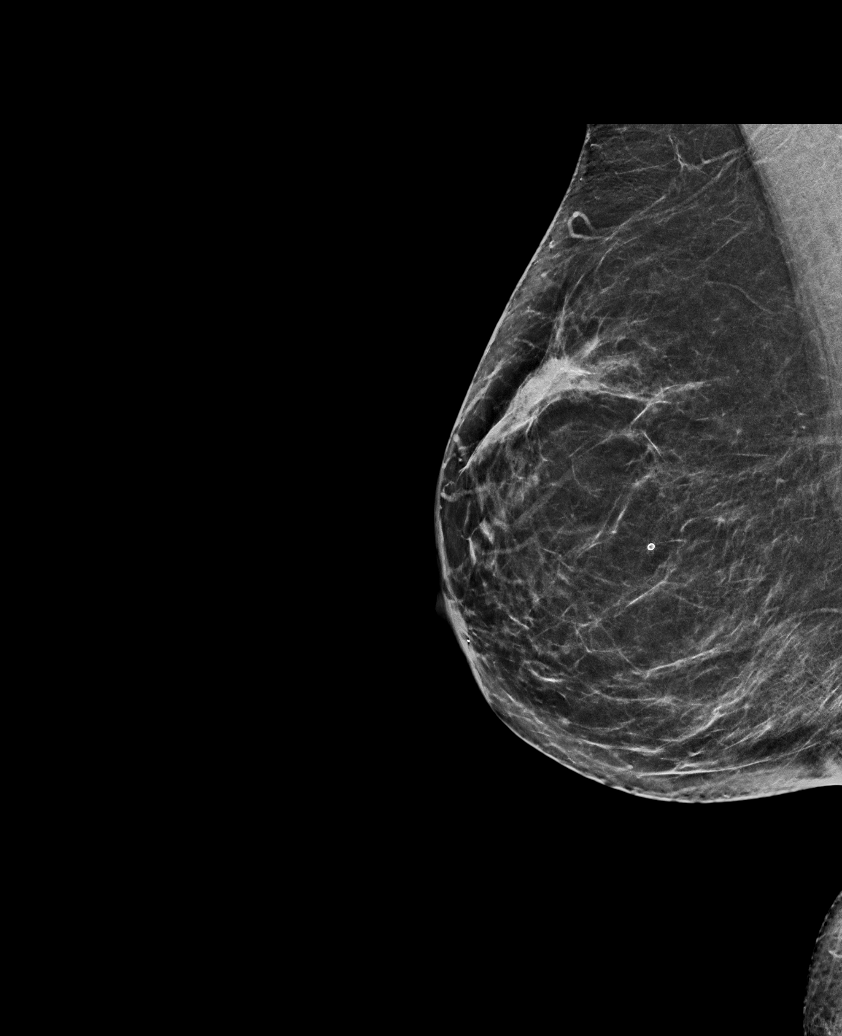

[L CC tomo · tomo slice 33/65.0]
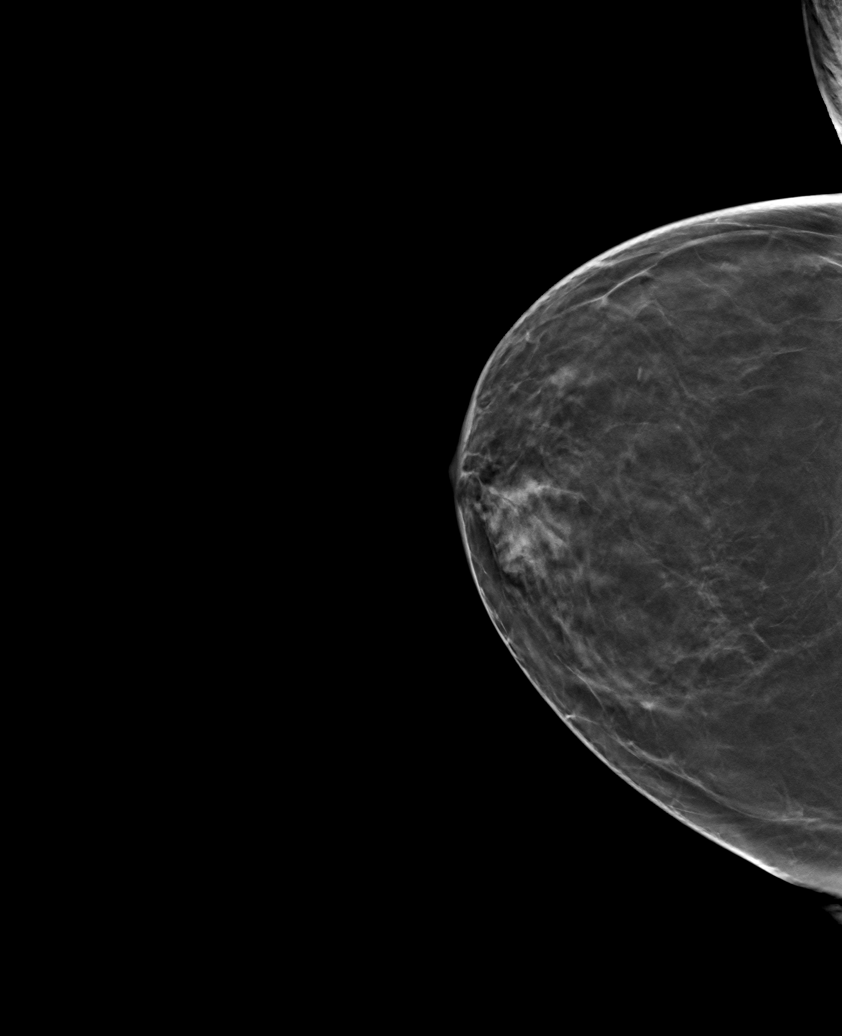

[6 of 30 positions shown; findings below may reference images not displayed]

ACR Breast Density Category b: There are scattered areas of
fibroglandular density.
FINDINGS: There are no findings suspicious for malignancy. Images were
processed with CAD.
IMPRESSION: No mammographic evidence of malignancy. A result letter of this
screening mammogram will be mailed directly to the patient.

RECOMMENDATION:
Screening mammogram in one year. (Code:[id number])

BI-RADS CATEGORY  1: Negative.

## 2019-02-20 ENCOUNTER — Other Ambulatory Visit: Payer: Self-pay | Admitting: Family Medicine

## 2019-02-20 DIAGNOSIS — Z1231 Encounter for screening mammogram for malignant neoplasm of breast: Secondary | ICD-10-CM

## 2019-04-07 ENCOUNTER — Ambulatory Visit
Admission: RE | Admit: 2019-04-07 | Discharge: 2019-04-07 | Disposition: A | Discharge status: Home / Self Care | Payer: Medicare HMO | Source: Ambulatory Visit | Attending: Family Medicine | Admitting: Family Medicine

## 2019-04-07 DIAGNOSIS — Z1231 Encounter for screening mammogram for malignant neoplasm of breast: Secondary | ICD-10-CM | POA: Insufficient documentation

## 2019-07-31 DIAGNOSIS — M1712 Unilateral primary osteoarthritis, left knee: Secondary | ICD-10-CM | POA: Diagnosis not present

## 2019-08-07 DIAGNOSIS — M1712 Unilateral primary osteoarthritis, left knee: Secondary | ICD-10-CM | POA: Diagnosis not present

## 2019-08-14 DIAGNOSIS — M1712 Unilateral primary osteoarthritis, left knee: Secondary | ICD-10-CM | POA: Diagnosis not present

## 2019-09-04 NOTE — Discharge Instructions (Signed)
Instructions after Total Knee Replacement   Paula Joyce P. Macy Lingenfelter, Jr., M.D.     Dept. of Orthopaedics & Sports Medicine  [facility]  [address]  [place], [place]  [address]  Phone: [phone]   Fax: [phone]    DIET: Drink plenty of non-alcoholic fluids. Resume your normal diet. Include foods high in fiber.  ACTIVITY:  You may use crutches or a walker with weight-bearing as tolerated, unless instructed otherwise. You may be weaned off of the walker or crutches by your Physical Therapist.  Do NOT place pillows under the knee. Anything placed under the knee could limit your ability to straighten the knee.   Continue doing gentle exercises. Exercising will reduce the pain and swelling, increase motion, and prevent muscle weakness.   Please continue to use the TED compression stockings for 6 weeks. You may remove the stockings at night, but should reapply them in the morning. Do not drive or operate any equipment until instructed.  WOUND CARE:  Continue to use the PolarCare or ice packs periodically to reduce pain and swelling. You may bathe or shower after the staples are removed at the first office visit following surgery.  MEDICATIONS: You may resume your regular medications. Please take the pain medication as prescribed on the medication. Do not take pain medication on an empty stomach. You have been given a prescription for a blood thinner (Lovenox or Coumadin). Please take the medication as instructed. (NOTE: After completing a 2 week course of Lovenox, take one Enteric-coated aspirin once a day. This along with elevation will help reduce the possibility of phlebitis in your operated leg.) Do not drive or drink alcoholic beverages when taking pain medications.  CALL THE OFFICE FOR: Temperature above 101 degrees Excessive bleeding or drainage on the dressing. Excessive swelling, coldness, or paleness of the toes. Persistent nausea and vomiting.  FOLLOW-UP:  You  should have an appointment to return to the office in 10-14 days after surgery. Arrangements have been made for continuation of Physical Therapy (either home therapy or outpatient therapy).   [facility] Department Directory         [email or web address]       [email or web address]          Cardiology  Appointments: [place] - [phone] [place] - [phone]  Endocrinology  Appointments: [place] - [phone] [place] - [phone]  Gastroenterology  Appointments: [place] - [phone] [place] - [phone]        General Surgery   Appointments: [place] - [phone]  Internal Medicine/Family Medicine  Appointments: [place] - [phone] [place] - [phone] [place] - [phone]  Metabolic and Weigh Loss Surgery  Appointments: [place] - [phone]        Neurology  Appointments: [place] - [phone] [place] - [phone]  Neurosurgery  Appointments: [place] - [phone]  Obstetrics & Gynecology  Appointments: [place] - [phone] [place] - [phone]        Pediatrics  Appointments: [place] - [phone] [place] - [phone]  Physiatry  Appointments: [place] [phone]  Physical Therapy  Appointments: [place] - [phone] [place] - [phone]        Podiatry  Appointments: [place] - [phone] [place] - [phone]  Pulmonology  Appointments: [place] - [phone]  Rheumatology  Appointments: [place] - [phone]        [place] Location: [facility]  [address] [place], [place]  [address]  [place] Location: [facility] [address] [place], [place]  [address]  [place] Location: [facility] [address] [place], [place]  [address]    

## 2019-09-16 ENCOUNTER — Other Ambulatory Visit
Admission: RE | Admit: 2019-09-16 | Discharge: 2019-09-16 | Disposition: A | Discharge status: Home / Self Care | Payer: PPO | Source: Ambulatory Visit | Attending: Orthopedic Surgery | Admitting: Orthopedic Surgery

## 2019-09-16 ENCOUNTER — Other Ambulatory Visit: Payer: Self-pay

## 2019-09-16 ENCOUNTER — Encounter
Admission: RE | Admit: 2019-09-16 | Discharge: 2019-09-16 | Disposition: A | Discharge status: Home / Self Care | Payer: PPO | Source: Ambulatory Visit | Attending: Orthopedic Surgery | Admitting: Orthopedic Surgery

## 2019-09-16 DIAGNOSIS — I10 Essential (primary) hypertension: Secondary | ICD-10-CM | POA: Insufficient documentation

## 2019-09-16 DIAGNOSIS — Z79899 Other long term (current) drug therapy: Secondary | ICD-10-CM | POA: Insufficient documentation

## 2019-09-16 DIAGNOSIS — Z01810 Encounter for preprocedural cardiovascular examination: Secondary | ICD-10-CM

## 2019-09-16 DIAGNOSIS — Z01818 Encounter for other preprocedural examination: Secondary | ICD-10-CM | POA: Insufficient documentation

## 2019-09-16 DIAGNOSIS — M1712 Unilateral primary osteoarthritis, left knee: Secondary | ICD-10-CM | POA: Insufficient documentation

## 2019-09-16 HISTORY — DX: Gastro-esophageal reflux disease without esophagitis: K21.9

## 2019-09-16 HISTORY — DX: Unspecified osteoarthritis, unspecified site: M19.90

## 2019-09-16 LAB — URINALYSIS, ROUTINE W REFLEX MICROSCOPIC
Bacteria, UA: NONE SEEN
Bilirubin Urine: NEGATIVE
Glucose, UA: NEGATIVE mg/dL
Hgb urine dipstick: NEGATIVE
Ketones, ur: NEGATIVE mg/dL
Nitrite: NEGATIVE
Protein, ur: NEGATIVE mg/dL
Specific Gravity, Urine: 1.008 (ref 1.005–1.030)
pH: 5 (ref 5.0–8.0)

## 2019-09-16 LAB — TYPE AND SCREEN
ABO/RH(D): A POS
Antibody Screen: NEGATIVE

## 2019-09-16 LAB — COMPREHENSIVE METABOLIC PANEL
ALT: 20 U/L (ref 0–44)
AST: 21 U/L (ref 15–41)
Albumin: 4.1 g/dL (ref 3.5–5.0)
Alkaline Phosphatase: 43 U/L (ref 38–126)
Anion gap: 10 (ref 5–15)
BUN: 17 mg/dL (ref 8–23)
CO2: 26 mmol/L (ref 22–32)
Calcium: 8.9 mg/dL (ref 8.9–10.3)
Chloride: 103 mmol/L (ref 98–111)
Creatinine, Ser: 0.76 mg/dL (ref 0.44–1.00)
GFR calc Af Amer: >60 mL/min (ref >60)
GFR calc non Af Amer: >60 mL/min (ref >60)
Glucose, Bld: 139 mg/dL — ABNORMAL HIGH (ref 70–99)
Potassium: 4.2 mmol/L (ref 3.5–5.1)
Sodium: 139 mmol/L (ref 135–145)
Total Bilirubin: 0.5 mg/dL (ref 0.3–1.2)
Total Protein: 6.9 g/dL (ref 6.5–8.1)

## 2019-09-16 LAB — CBC
HCT: 46.2 % — ABNORMAL HIGH (ref 36.0–46.0)
Hemoglobin: 14.5 g/dL (ref 12.0–15.0)
MCH: 27.6 pg (ref 26.0–34.0)
MCHC: 31.4 g/dL (ref 30.0–36.0)
MCV: 87.8 fL (ref 80.0–100.0)
Platelets: 208 10*3/uL (ref 150–400)
RBC: 5.26 MIL/uL — ABNORMAL HIGH (ref 3.87–5.11)
RDW: 13.7 % (ref 11.5–15.5)
WBC: 9.2 10*3/uL (ref 4.0–10.5)
nRBC: 0 % (ref 0.0–0.2)

## 2019-09-16 LAB — APTT: aPTT: 32 seconds (ref 24–36)

## 2019-09-16 LAB — SURGICAL PCR SCREEN
MRSA, PCR: NEGATIVE
Staphylococcus aureus: POSITIVE — AB

## 2019-09-16 LAB — SEDIMENTATION RATE: Sed Rate: 3 mm/hr (ref 0–30)

## 2019-09-16 LAB — C-REACTIVE PROTEIN: CRP: 1.6 mg/dL — ABNORMAL HIGH (ref <1.0)

## 2019-09-16 LAB — PROTIME-INR
INR: 1 (ref 0.8–1.2)
Prothrombin Time: 12.9 seconds (ref 11.4–15.2)

## 2019-09-16 NOTE — Patient Instructions (Signed)
Your procedure is scheduled on: Monday 09/22/19  Report to [place]. To find out your arrival time please call [phone] between 1PM - 3PM on Friday 09/19/19.   Remember: Instructions that are not followed completely may result in serious medical risk, up to and including death, or upon the discretion of your surgeon and anesthesiologist your surgery may need to be rescheduled.      _X__ 1. Do not eat food after midnight the night before your procedure.                 No gum chewing or hard candies. You may drink clear liquids up to 2 hours                 before you are scheduled to arrive for your surgery- DO NOT drink clear                 liquids within 2 hours of the start of your surgery.                 Clear Liquids include:  water, apple juice without pulp, clear carbohydrate                 drink such as Clearfast or Gatorade, Black Coffee or Tea (Do not add                 anything to coffee or tea).   ** Dr. Marry Guan wants you to finish the Clear Pre-Surgery Ensure 2 hours prior to your arrival on the morning of surgery.**    __X__2.  On the morning of surgery brush your teeth with toothpaste and water, you may rinse your mouth with mouthwash if you wish.  Do not swallow any toothpaste or mouthwash.    __X__3.  Notify your doctor if there is any change in your medical condition      (cold, fever, infections).       Do not wear jewelry, make-up, hairpins, clips or nail polish. Do not wear lotions, powders, or perfumes.  Do not shave 48 hours prior to surgery. Men may shave face and neck. Do not bring valuables to the hospital.     [facility] is not responsible for any belongings or valuables.   Contacts, dentures/partials or body piercings may not be worn into surgery. Bring a case for your contacts, glasses or hearing aids, a denture cup will be supplied.    Patients discharged the day of surgery will not  be allowed to drive home.    __X__ Take these medicines the morning of surgery with A SIP OF WATER:     1. acetaminophen (TYLENOL) if needed     __X__ Use CHG Soap as directed    __X__ Stop Anti-inflammatories 7 days before surgery such as Advil, Ibuprofen, Motrin, BC or Goodies Powder, Naprosyn, Naproxen, Aleve, Aspirin, Meloxicam. May take Tylenol if needed for pain or discomfort.    __X__ Don't start taking any new herbal supplements or vitamins prior to your procedure.

## 2019-09-18 ENCOUNTER — Other Ambulatory Visit: Payer: Self-pay

## 2019-09-18 ENCOUNTER — Other Ambulatory Visit
Admission: RE | Admit: 2019-09-18 | Discharge: 2019-09-18 | Disposition: A | Discharge status: Home / Self Care | Payer: PPO | Source: Ambulatory Visit | Attending: Orthopedic Surgery | Admitting: Orthopedic Surgery

## 2019-09-18 DIAGNOSIS — Z01812 Encounter for preprocedural laboratory examination: Secondary | ICD-10-CM | POA: Diagnosis not present

## 2019-09-18 DIAGNOSIS — Z20822 Contact with and (suspected) exposure to covid-19: Secondary | ICD-10-CM | POA: Diagnosis not present

## 2019-09-18 LAB — SARS CORONAVIRUS 2 (TAT 6-24 HRS): SARS Coronavirus 2: NEGATIVE

## 2019-09-18 LAB — URINE CULTURE: Special Requests: NORMAL

## 2019-09-18 NOTE — Pre-Procedure Instructions (Addendum)
Secure chat sent to Dr Marry Guan ie: urine culture results suggest recollection.   Me: Urine culture notes multiple species in urine and suggest recollection. Thoughts? Can be done Monday DOS 3/8.   Dr Marry Guan: UA not suggestive of UTI. Should be OK for surgery

## 2019-09-21 MED ORDER — TRANEXAMIC ACID-NACL 1000-0.7 MG/100ML-% IV SOLN: 1000.0000 mg | INTRAVENOUS | Status: DC

## 2019-09-21 NOTE — H&P (Signed)
ORTHOPAEDIC HISTORY & PHYSICAL Progress Notes by Gwenlyn Fudge, PA at 09/16/2019 2:15 PM  [facility] Chief Complaint:       Chief Complaint  Patient presents with  . Knee Pain    H&P Left TKA 09/22/19    History of Present Illness:    Paula Joyce is a 86 y.o. female that presents to clinic today for her preoperative history and evaluation.  Patient presents unaccompanied. The patient is scheduled to undergo a left total knee arthroplasty on 09/22/19 by Dr. Marry Guan. Her pain began years ago.  The pain is located primarily along the medial aspect of the knee.  She describes her pain as worse with weightbearing.  She reports associated swelling and giving way of the knee.  She denies associated numbness or tingling, denies any locking of the knee.   The patient's symptoms have progressed to the point that they decrease her quality of life. The patient has previously undergone conservative treatment including NSAIDS and injections to the knee without adequate control of her symptoms.  Of note, patient denies history of blood clots or significant cardiac history. Patient also states that she would like to do her therapy at [facility] following surgery, and provides the fax number for the order to be sent to the physical therapy department there.   Past Medical, Surgical, Family, Social History, Allergies, Medications:   Past Medical History:      Past Medical History:  Diagnosis Date  . Anemia, unspecified   . Degenerative arthritis    Hands.  . Essential hypertension, benign   . H/O degenerative disc disease   . Hemorrhage of rectum and anus   . History of TIAs    Manifested by visual loss.  Ultrasound of carotids 08/11 with 50% bilateral lesions.   . Osteoarthritis   . Other and unspecified hyperlipidemia   . Unspecified hypothyroidism     Past Surgical History:       Past Surgical History:  Procedure  Laterality Date  . CHOLECYSTECTOMY  01/07/2008   Laparoscopic cholecystectomy with cholangiography.  . transvaginal hysterectomy      Current Medications:  Current Medications        Current Outpatient Medications  Medication Sig Dispense Refill  . acetaminophen (TYLENOL) 500 mg capsule Take 500 mg by mouth every 4 (four) hours as needed for Fever.    . bismuth subsalicylate (PEPTO BISMOL) 262 mg/15 mL suspension Take by mouth    . ezetimibe (ZETIA) 10 mg tablet Take 1 tablet (10 mg total) by mouth once daily 90 tablet 3  . levothyroxine (SYNTHROID) 112 MCG tablet Take 1 tablet (112 mcg total) by mouth once daily Take on an empty stomach with a glass of water at least 30-60 minutes before breakfast. 90 tablet 3  . melatonin 5 mg Cap Take 5 mg by mouth     No current facility-administered medications for this visit.       Allergies:       Allergies  Allergen Reactions  . Latex Itching and Other (See Comments)    Redness/itching  . Adhesive Tape-Silicones Itching    Redness Other reaction(s): Other (See Comments) Redness/itchness  . Crestor [Rosuvastatin] Muscle Pain  . Livalo [Pitavastatin] Muscle Pain  . Simvastatin Muscle Pain    Social History:  Social History  Social History        Socioeconomic History  . Marital status: Widowed    Spouse name: Shanon Brow  . Number  of children: 3  . Years of education: 69  . Highest education level: Not on file  Occupational History  . Occupation: Retired- Animal nutritionist  . Financial resource strain: Not on file  . Food insecurity    Worry: Not on file    Inability: Not on file  . Transportation needs    Medical: Not on file    Non-medical: Not on file  Tobacco Use  . Smoking status: Never Smoker  . Smokeless tobacco: Never Used  Substance and Sexual Activity  . Alcohol use: Not Currently    Alcohol/week: 0.0 standard drinks    Comment: Rare wine.  . Drug use: Never  . Sexual  activity: Defer    Partners: Male  Lifestyle  . Physical activity    Days per week: Not on file    Minutes per session: Not on file  . Stress: Not on file  Relationships  . Social Herbalist on phone: Not on file    Gets together: Not on file    Attends religious service: Not on file    Active member of club or organization: Not on file    Attends meetings of clubs or organizations: Not on file    Relationship status: Not on file  Other Topics Concern  . Not on file  Social History Narrative  . Not on file      Family History:       Family History  Problem Relation Age of Onset  . Myocardial Infarction (Heart attack) Father   . High blood pressure (Hypertension) Sister   . Inflammatory bowel disease Sister     Review of Systems:   A 10+ ROS was performed, reviewed, and the pertinent orthopaedic findings are documented in the HPI.    Physical Examination:   BP 134/80 (BP Location: Left upper arm, Patient Position: Sitting, BP Cuff Size: Adult)   Ht 160 cm (5\' 3" )   Wt 70 kg (154 lb 6.4 oz)   BMI 27.35 kg/m   Patient is a well-developed, well-nourished female in no acute distress. Patient has normal mood and affect. Patient is alert and oriented to person, place, and time.   HEENT: Atraumatic, normocephalic.  Pupils equal and reactive to light.  Extraocular motion intact.  Noninjected sclera.  Cardiovascular: Regular rate and rhythm, with no murmurs, rubs, or gallops.  Distal pulses auscultated with doppler.  Respiratory: Lungs clear to auscultation bilaterally.   LeftKnee: Soft tissue swelling:minimal Effusion:none Erythema:none Crepitance:mild Tenderness:medial Alignment:relative varus Mediolateral laxity:medial pseudolaxity Posterior BD:[id number] Patellar tracking:Good tracking without  evidence of subluxation or tilt Atrophy:No significantatrophy.  Quadriceps tone was good. Range of motion:0/0/120degrees  Sensation intact over the saphenous, lateral sural cutaneous, superficial fibular, and deep fibular nerve distributions.  Tests Performed/Reviewed:  X-rays  Anteroposterior, lateral, and sunrise views of the left knee were obtained.  Images reveal severe loss of medial joint space with osteophyte formation and subchondral sclerosis of the bone.  Lateral compartment appears relatively well-preserved.  Patellofemoral joint reveals loss of joint space with osteophyte formation.  No fractures or dislocations noted.  Impression:     ICD-10-CM  1. Primary osteoarthritis of left knee  M17.12   Plan:   The patient has end-stage degenerative changes of the left knee.  It was explained to the patient that the condition is progressive in nature.  Having failed conservative treatment, the patient has elected to proceed with a total joint arthroplasty.  The patient will undergo a total  joint arthroplasty with Dr. Marry Guan.  The risks of surgery, including blood clot and infection, were discussed with the patient.  Measures to reduce these risks, including the use of anticoagulation, perioperative antibiotics, and early ambulation were discussed.  The importance of postoperative physical therapy was discussed with the patient. The patient elects to proceed with surgery. The patient is instructed to stop all blood thinners prior to surgery.  The patient is instructed to call the hospital the day before surgery to learn of the proper arrival time.    Contact our office with any questions or concerns.  Follow up as indicated, or sooner should any new problems arise, if conditions worsen, or if they are otherwise concerned.   Gwenlyn Fudge, PA [facility] [place], [place] [address] Phone: [phone]  This note was generated in part with voice recognition software and I apologize for any typographical errors that were not detected and corrected.     Electronically signed by Gwenlyn Fudge, [place] on 09/16/2019 6:42 PM

## 2019-09-22 ENCOUNTER — Inpatient Hospital Stay: Payer: PPO | Admitting: Family

## 2019-09-22 ENCOUNTER — Other Ambulatory Visit: Payer: Self-pay

## 2019-09-22 ENCOUNTER — Encounter: Payer: Self-pay | Admitting: Orthopedic Surgery

## 2019-09-22 ENCOUNTER — Inpatient Hospital Stay
Admission: RE | Admit: 2019-09-22 | Discharge: 2019-09-24 | DRG: 470 | Disposition: A | Discharge status: Home Health | Payer: PPO | Attending: Orthopedic Surgery | Admitting: Orthopedic Surgery

## 2019-09-22 ENCOUNTER — Inpatient Hospital Stay: Payer: PPO

## 2019-09-22 ENCOUNTER — Encounter
Admission: RE | Disposition: A | Discharge status: Home Health | Payer: Self-pay | Source: Home / Self Care | Attending: Orthopedic Surgery

## 2019-09-22 DIAGNOSIS — E785 Hyperlipidemia, unspecified: Secondary | ICD-10-CM | POA: Diagnosis not present

## 2019-09-22 DIAGNOSIS — K219 Gastro-esophageal reflux disease without esophagitis: Secondary | ICD-10-CM | POA: Diagnosis present

## 2019-09-22 DIAGNOSIS — Z96652 Presence of left artificial knee joint: Secondary | ICD-10-CM | POA: Diagnosis not present

## 2019-09-22 DIAGNOSIS — Z96659 Presence of unspecified artificial knee joint: Secondary | ICD-10-CM

## 2019-09-22 DIAGNOSIS — M1712 Unilateral primary osteoarthritis, left knee: Principal | ICD-10-CM | POA: Diagnosis present

## 2019-09-22 DIAGNOSIS — Z79899 Other long term (current) drug therapy: Secondary | ICD-10-CM | POA: Diagnosis not present

## 2019-09-22 DIAGNOSIS — I10 Essential (primary) hypertension: Secondary | ICD-10-CM | POA: Diagnosis present

## 2019-09-22 DIAGNOSIS — Z20822 Contact with and (suspected) exposure to covid-19: Secondary | ICD-10-CM | POA: Diagnosis present

## 2019-09-22 DIAGNOSIS — Z90710 Acquired absence of both cervix and uterus: Secondary | ICD-10-CM | POA: Diagnosis not present

## 2019-09-22 DIAGNOSIS — Z79890 Hormone replacement therapy (postmenopausal): Secondary | ICD-10-CM

## 2019-09-22 DIAGNOSIS — E039 Hypothyroidism, unspecified: Secondary | ICD-10-CM | POA: Diagnosis present

## 2019-09-22 DIAGNOSIS — Z471 Aftercare following joint replacement surgery: Secondary | ICD-10-CM | POA: Diagnosis not present

## 2019-09-22 DIAGNOSIS — Z8673 Personal history of transient ischemic attack (TIA), and cerebral infarction without residual deficits: Secondary | ICD-10-CM

## 2019-09-22 HISTORY — PX: KNEE ARTHROPLASTY: SHX992

## 2019-09-22 LAB — ABO/RH: ABO/RH(D): A POS

## 2019-09-22 SURGERY — ARTHROPLASTY, KNEE, TOTAL, USING IMAGELESS COMPUTER-ASSISTED NAVIGATION
Anesthesia: Spinal | Site: Knee | Laterality: Left

## 2019-09-22 MED ORDER — FLEET ENEMA 7-19 GM/118ML RE ENEM: 1.0000 | ENEMA | Freq: Once | RECTAL | Status: DC | PRN

## 2019-09-22 MED ORDER — CHLORHEXIDINE GLUCONATE 4 % EX LIQD: 60.0000 mL | Freq: Once | CUTANEOUS | Status: DC

## 2019-09-22 MED ORDER — TRANEXAMIC ACID-NACL 1000-0.7 MG/100ML-% IV SOLN
INTRAVENOUS | Status: AC
  Filled 2019-09-22: qty 100

## 2019-09-22 MED ORDER — ALUM & MAG HYDROXIDE-SIMETH 200-200-20 MG/5ML PO SUSP
30.0000 mL | ORAL | Status: DC | PRN
  Administered 2019-09-24: 30 mL via ORAL
  Filled 2019-09-22: qty 30

## 2019-09-22 MED ORDER — FAMOTIDINE 20 MG PO TABS: 20.0000 mg | ORAL_TABLET | Freq: Once | ORAL | Status: AC

## 2019-09-22 MED ORDER — GABAPENTIN 300 MG PO CAPS
ORAL_CAPSULE | ORAL | Status: AC
  Administered 2019-09-22: 300 mg via ORAL
  Filled 2019-09-22: qty 1

## 2019-09-22 MED ORDER — CEFAZOLIN SODIUM-DEXTROSE 2-4 GM/100ML-% IV SOLN
2.0000 g | INTRAVENOUS | Status: AC
  Administered 2019-09-22: 2 g via INTRAVENOUS

## 2019-09-22 MED ORDER — ENOXAPARIN SODIUM 30 MG/0.3ML ~~LOC~~ SOLN
30.0000 mg | Freq: Two times a day (BID) | SUBCUTANEOUS | Status: DC
  Administered 2019-09-23 – 2019-09-24 (×3): 30 mg via SUBCUTANEOUS
  Filled 2019-09-22 (×3): qty 0.3

## 2019-09-22 MED ORDER — PHENOL 1.4 % MT LIQD
1.0000 | OROMUCOSAL | Status: DC | PRN
  Filled 2019-09-22: qty 177

## 2019-09-22 MED ORDER — FENTANYL CITRATE (PF) 100 MCG/2ML IJ SOLN: 25.0000 ug | INTRAMUSCULAR | Status: DC | PRN

## 2019-09-22 MED ORDER — MENTHOL 3 MG MT LOZG
1.0000 | LOZENGE | OROMUCOSAL | Status: DC | PRN
  Administered 2019-09-23 (×2): 3 mg via ORAL
  Filled 2019-09-22 (×2): qty 9

## 2019-09-22 MED ORDER — BUPIVACAINE HCL (PF) 0.25 % IJ SOLN
INTRAMUSCULAR | Status: DC | PRN
  Administered 2019-09-22: 60 mL

## 2019-09-22 MED ORDER — DIPHENHYDRAMINE HCL 12.5 MG/5ML PO ELIX: 12.5000 mg | ORAL_SOLUTION | ORAL | Status: DC | PRN

## 2019-09-22 MED ORDER — PANTOPRAZOLE SODIUM 40 MG PO TBEC
40.0000 mg | DELAYED_RELEASE_TABLET | Freq: Two times a day (BID) | ORAL | Status: DC
  Administered 2019-09-22 – 2019-09-24 (×4): 40 mg via ORAL
  Filled 2019-09-22 (×4): qty 1

## 2019-09-22 MED ORDER — PROPOFOL 500 MG/50ML IV EMUL
INTRAVENOUS | Status: DC | PRN
  Administered 2019-09-22: 60 ug/kg/min via INTRAVENOUS

## 2019-09-22 MED ORDER — ACETAMINOPHEN 10 MG/ML IV SOLN
INTRAVENOUS | Status: DC | PRN
  Administered 2019-09-22: 1000 mg via INTRAVENOUS

## 2019-09-22 MED ORDER — ONDANSETRON HCL 4 MG PO TABS: 4.0000 mg | ORAL_TABLET | Freq: Four times a day (QID) | ORAL | Status: DC | PRN

## 2019-09-22 MED ORDER — CELECOXIB 200 MG PO CAPS
200.0000 mg | ORAL_CAPSULE | Freq: Two times a day (BID) | ORAL | Status: DC
  Administered 2019-09-22 – 2019-09-24 (×4): 200 mg via ORAL
  Filled 2019-09-22 (×4): qty 1

## 2019-09-22 MED ORDER — DEXAMETHASONE SODIUM PHOSPHATE 10 MG/ML IJ SOLN: 8.0000 mg | Freq: Once | INTRAMUSCULAR | Status: AC

## 2019-09-22 MED ORDER — TRANEXAMIC ACID-NACL 1000-0.7 MG/100ML-% IV SOLN
1000.0000 mg | Freq: Once | INTRAVENOUS | Status: AC
  Administered 2019-09-22: 1000 mg via INTRAVENOUS

## 2019-09-22 MED ORDER — ONDANSETRON HCL 4 MG/2ML IJ SOLN
INTRAMUSCULAR | Status: DC | PRN
  Administered 2019-09-22: 4 mg via INTRAVENOUS

## 2019-09-22 MED ORDER — TRANEXAMIC ACID-NACL 1000-0.7 MG/100ML-% IV SOLN
INTRAVENOUS | Status: DC | PRN
  Administered 2019-09-22: 1000 mg via INTRAVENOUS

## 2019-09-22 MED ORDER — ACETAMINOPHEN 10 MG/ML IV SOLN
1000.0000 mg | Freq: Four times a day (QID) | INTRAVENOUS | Status: AC
  Administered 2019-09-22 – 2019-09-23 (×3): 1000 mg via INTRAVENOUS
  Filled 2019-09-22 (×4): qty 100

## 2019-09-22 MED ORDER — CEFAZOLIN SODIUM-DEXTROSE 2-4 GM/100ML-% IV SOLN
INTRAVENOUS | Status: AC
  Administered 2019-09-23: 01:00:00 2 g via INTRAVENOUS
  Filled 2019-09-22: qty 100

## 2019-09-22 MED ORDER — SODIUM CHLORIDE 0.9 % IV SOLN: INTRAVENOUS | Status: DC

## 2019-09-22 MED ORDER — ACETAMINOPHEN 10 MG/ML IV SOLN
INTRAVENOUS | Status: AC
  Filled 2019-09-22: qty 100

## 2019-09-22 MED ORDER — GABAPENTIN 300 MG PO CAPS: 300.0000 mg | ORAL_CAPSULE | Freq: Once | ORAL | Status: AC

## 2019-09-22 MED ORDER — ONDANSETRON HCL 4 MG/2ML IJ SOLN: 4.0000 mg | Freq: Once | INTRAMUSCULAR | Status: DC | PRN

## 2019-09-22 MED ORDER — TRAMADOL HCL 50 MG PO TABS
50.0000 mg | ORAL_TABLET | ORAL | Status: DC | PRN
  Administered 2019-09-23: 50 mg via ORAL
  Filled 2019-09-22: qty 1

## 2019-09-22 MED ORDER — BUPIVACAINE HCL (PF) 0.5 % IJ SOLN
INTRAMUSCULAR | Status: DC | PRN
  Administered 2019-09-22: 3 mL

## 2019-09-22 MED ORDER — BUPIVACAINE LIPOSOME 1.3 % IJ SUSP
INTRAMUSCULAR | Status: AC
  Filled 2019-09-22: qty 20

## 2019-09-22 MED ORDER — FERROUS SULFATE 325 (65 FE) MG PO TABS
325.0000 mg | ORAL_TABLET | Freq: Two times a day (BID) | ORAL | Status: DC
  Administered 2019-09-23 – 2019-09-24 (×3): 325 mg via ORAL
  Filled 2019-09-22 (×3): qty 1

## 2019-09-22 MED ORDER — EZETIMIBE 10 MG PO TABS
10.0000 mg | ORAL_TABLET | Freq: Every day | ORAL | Status: DC
  Administered 2019-09-22 – 2019-09-23 (×2): 10 mg via ORAL
  Filled 2019-09-22 (×3): qty 1

## 2019-09-22 MED ORDER — LACTATED RINGERS IV SOLN: INTRAVENOUS | Status: DC | PRN

## 2019-09-22 MED ORDER — NEOMYCIN-POLYMYXIN B GU 40-200000 IR SOLN
Status: AC
  Filled 2019-09-22: qty 20

## 2019-09-22 MED ORDER — BISACODYL 10 MG RE SUPP
10.0000 mg | Freq: Every day | RECTAL | Status: DC | PRN
  Administered 2019-09-23: 10 mg via RECTAL

## 2019-09-22 MED ORDER — OXYCODONE HCL 5 MG PO TABS
5.0000 mg | ORAL_TABLET | ORAL | Status: DC | PRN
  Administered 2019-09-22 – 2019-09-24 (×4): 5 mg via ORAL
  Filled 2019-09-22 (×4): qty 1

## 2019-09-22 MED ORDER — FAMOTIDINE 20 MG PO TABS
ORAL_TABLET | ORAL | Status: AC
  Administered 2019-09-22: 20 mg via ORAL
  Filled 2019-09-22: qty 1

## 2019-09-22 MED ORDER — METOCLOPRAMIDE HCL 10 MG PO TABS
10.0000 mg | ORAL_TABLET | Freq: Three times a day (TID) | ORAL | Status: DC
  Administered 2019-09-22 – 2019-09-23 (×2): 10 mg via ORAL
  Filled 2019-09-22 (×4): qty 1

## 2019-09-22 MED ORDER — MAGNESIUM HYDROXIDE 400 MG/5ML PO SUSP
30.0000 mL | Freq: Every day | ORAL | Status: DC
  Administered 2019-09-23 – 2019-09-24 (×2): 30 mL via ORAL
  Filled 2019-09-22 (×2): qty 30

## 2019-09-22 MED ORDER — HYDROMORPHONE HCL 1 MG/ML IJ SOLN: 0.5000 mg | INTRAMUSCULAR | Status: DC | PRN

## 2019-09-22 MED ORDER — ENSURE PRE-SURGERY PO LIQD
296.0000 mL | Freq: Once | ORAL | Status: DC
  Filled 2019-09-22: qty 296

## 2019-09-22 MED ORDER — SODIUM CHLORIDE FLUSH 0.9 % IV SOLN
INTRAVENOUS | Status: AC
  Filled 2019-09-22: qty 40

## 2019-09-22 MED ORDER — OXYCODONE HCL 5 MG PO TABS
10.0000 mg | ORAL_TABLET | ORAL | Status: DC | PRN
  Administered 2019-09-22 – 2019-09-23 (×2): 10 mg via ORAL
  Filled 2019-09-22 (×2): qty 2

## 2019-09-22 MED ORDER — CELECOXIB 200 MG PO CAPS
ORAL_CAPSULE | ORAL | Status: AC
  Administered 2019-09-22: 10:00:00 400 mg via ORAL
  Filled 2019-09-22: qty 2

## 2019-09-22 MED ORDER — DEXAMETHASONE SODIUM PHOSPHATE 10 MG/ML IJ SOLN
INTRAMUSCULAR | Status: AC
  Administered 2019-09-22: 8 mg via INTRAVENOUS
  Filled 2019-09-22: qty 1

## 2019-09-22 MED ORDER — GABAPENTIN 300 MG PO CAPS
300.0000 mg | ORAL_CAPSULE | Freq: Every day | ORAL | Status: DC
  Administered 2019-09-22 – 2019-09-23 (×2): 300 mg via ORAL
  Filled 2019-09-22 (×2): qty 1

## 2019-09-22 MED ORDER — CELECOXIB 200 MG PO CAPS: 400.0000 mg | ORAL_CAPSULE | Freq: Once | ORAL | Status: AC

## 2019-09-22 MED ORDER — SODIUM CHLORIDE 0.9 % IV SOLN
INTRAVENOUS | Status: DC | PRN
  Administered 2019-09-22: 60 mL

## 2019-09-22 MED ORDER — NEOMYCIN-POLYMYXIN B GU 40-200000 IR SOLN
Status: DC | PRN
  Administered 2019-09-22: 2 mL

## 2019-09-22 MED ORDER — ACETAMINOPHEN 325 MG PO TABS
325.0000 mg | ORAL_TABLET | Freq: Four times a day (QID) | ORAL | Status: DC | PRN
  Administered 2019-09-23: 650 mg via ORAL
  Filled 2019-09-22: qty 2

## 2019-09-22 MED ORDER — PHENYLEPHRINE HCL (PRESSORS) 10 MG/ML IV SOLN
INTRAVENOUS | Status: AC
  Filled 2019-09-22: qty 1

## 2019-09-22 MED ORDER — METOCLOPRAMIDE HCL 10 MG PO TABS: 5.0000 mg | ORAL_TABLET | Freq: Three times a day (TID) | ORAL | Status: DC | PRN

## 2019-09-22 MED ORDER — POTASSIUM CHLORIDE 2 MEQ/ML IV SOLN: INTRAVENOUS | Status: DC

## 2019-09-22 MED ORDER — PROPOFOL 500 MG/50ML IV EMUL
INTRAVENOUS | Status: AC
  Filled 2019-09-22: qty 50

## 2019-09-22 MED ORDER — ONDANSETRON HCL 4 MG/2ML IJ SOLN: 4.0000 mg | Freq: Four times a day (QID) | INTRAMUSCULAR | Status: DC | PRN

## 2019-09-22 MED ORDER — METOCLOPRAMIDE HCL 5 MG/ML IJ SOLN: 5.0000 mg | Freq: Three times a day (TID) | INTRAMUSCULAR | Status: DC | PRN

## 2019-09-22 MED ORDER — BUPIVACAINE HCL (PF) 0.25 % IJ SOLN
INTRAMUSCULAR | Status: AC
  Filled 2019-09-22: qty 60

## 2019-09-22 MED ORDER — LEVOTHYROXINE SODIUM 112 MCG PO TABS
112.0000 ug | ORAL_TABLET | Freq: Every day | ORAL | Status: DC
  Administered 2019-09-22 – 2019-09-23 (×2): 112 ug via ORAL
  Filled 2019-09-22 (×3): qty 1

## 2019-09-22 MED ORDER — ONDANSETRON HCL 4 MG/2ML IJ SOLN
INTRAMUSCULAR | Status: AC
  Filled 2019-09-22: qty 2

## 2019-09-22 MED ORDER — SENNOSIDES-DOCUSATE SODIUM 8.6-50 MG PO TABS
1.0000 | ORAL_TABLET | Freq: Two times a day (BID) | ORAL | Status: DC
  Administered 2019-09-22 – 2019-09-24 (×3): 1 via ORAL
  Filled 2019-09-22 (×4): qty 1

## 2019-09-22 MED ORDER — SODIUM CHLORIDE 0.9 % IV SOLN
INTRAVENOUS | Status: DC | PRN
  Administered 2019-09-22: 30 ug/min via INTRAVENOUS

## 2019-09-22 MED ORDER — CEFAZOLIN SODIUM-DEXTROSE 2-4 GM/100ML-% IV SOLN
2.0000 g | Freq: Four times a day (QID) | INTRAVENOUS | Status: AC
  Administered 2019-09-22 – 2019-09-23 (×3): 2 g via INTRAVENOUS
  Filled 2019-09-22 (×4): qty 100

## 2019-09-22 SURGICAL SUPPLY — 72 items
ATTUNE PS FEM LT SZ 4 CEM KNEE (Femur) ×2 IMPLANT
ATTUNE PSRP INSR SZ4 10 KNEE (Insert) ×1 IMPLANT
ATTUNE PSRP INSR SZ4 10MM KNEE (Insert) ×1 IMPLANT
BASEPLATE TIBIAL ROTATING SZ 4 (Knees) ×2 IMPLANT
BATTERY INSTRU NAVIGATION (MISCELLANEOUS) ×12 IMPLANT
BLADE SAW 70X12.5 (BLADE) ×3 IMPLANT
BLADE SAW 90X13X1.19 OSCILLAT (BLADE) ×3 IMPLANT
BLADE SAW 90X25X1.19 OSCILLAT (BLADE) ×3 IMPLANT
CANISTER SUCT 3000ML PPV (MISCELLANEOUS) ×3 IMPLANT
CEMENT HV SMART SET (Cement) ×4 IMPLANT
COOLER POLAR GLACIER W/PUMP (MISCELLANEOUS) ×3 IMPLANT
COVER WAND RF STERILE (DRAPES) ×3 IMPLANT
CUFF TOURN SGL QUICK 24 (TOURNIQUET CUFF) ×3
CUFF TOURN SGL QUICK 30 (TOURNIQUET CUFF)
CUFF TRNQT CYL 24X4X16.5-23 (TOURNIQUET CUFF) IMPLANT
CUFF TRNQT CYL 30X4X21-28X (TOURNIQUET CUFF) IMPLANT
DRAPE 3/4 80X56 (DRAPES) ×3 IMPLANT
DRSG DERMACEA 8X12 NADH (GAUZE/BANDAGES/DRESSINGS) ×3 IMPLANT
DRSG OPSITE POSTOP 4X14 (GAUZE/BANDAGES/DRESSINGS) ×3 IMPLANT
DRSG TEGADERM 4X4.75 (GAUZE/BANDAGES/DRESSINGS) ×3 IMPLANT
DURAPREP 26ML APPLICATOR (WOUND CARE) ×4 IMPLANT
ELECT REM PT RETURN 9FT ADLT (ELECTROSURGICAL) ×3
ELECTRODE REM PT RTRN 9FT ADLT (ELECTROSURGICAL) ×1 IMPLANT
EX-PIN ORTHOLOCK NAV 4X150 (PIN) ×6 IMPLANT
GLOVE BIO SURGEON STRL SZ7.5 (GLOVE) ×6 IMPLANT
GLOVE BIOGEL M STRL SZ7.5 (GLOVE) ×6 IMPLANT
GLOVE BIOGEL PI IND STRL 7.5 (GLOVE) ×1 IMPLANT
GLOVE BIOGEL PI INDICATOR 7.5 (GLOVE) ×2
GLOVE INDICATOR 8.0 STRL GRN (GLOVE) ×3 IMPLANT
GOWN STRL REUS W/ TWL LRG LVL3 (GOWN DISPOSABLE) ×2 IMPLANT
GOWN STRL REUS W/ TWL XL LVL3 (GOWN DISPOSABLE) ×1 IMPLANT
GOWN STRL REUS W/TWL LRG LVL3 (GOWN DISPOSABLE) ×6
GOWN STRL REUS W/TWL XL LVL3 (GOWN DISPOSABLE) ×3
HEMOVAC 400CC 10FR (MISCELLANEOUS) ×3 IMPLANT
HOLDER FOLEY CATH W/STRAP (MISCELLANEOUS) ×3 IMPLANT
HOOD PEEL AWAY FLYTE STAYCOOL (MISCELLANEOUS) ×6 IMPLANT
KIT TURNOVER KIT A (KITS) ×3 IMPLANT
KNIFE SCULPS 14X20 (INSTRUMENTS) ×3 IMPLANT
LABEL OR SOLS (LABEL) ×3 IMPLANT
MANIFOLD NEPTUNE II (INSTRUMENTS) ×3 IMPLANT
NDL SAFETY ECLIPSE 18X1.5 (NEEDLE) ×1 IMPLANT
NDL SPNL 20GX3.5 QUINCKE YW (NEEDLE) ×2 IMPLANT
NEEDLE HYPO 18GX1.5 SHARP (NEEDLE) ×3
NEEDLE SPNL 20GX3.5 QUINCKE YW (NEEDLE) ×6 IMPLANT
NS IRRIG 500ML POUR BTL (IV SOLUTION) ×3 IMPLANT
PACK TOTAL KNEE (MISCELLANEOUS) ×3 IMPLANT
PAD WRAPON POLAR KNEE (MISCELLANEOUS) ×1 IMPLANT
PATELLA MEDIAL ATTUN 35MM KNEE (Knees) ×2 IMPLANT
PENCIL SMOKE EVACUATOR COATED (MISCELLANEOUS) ×3 IMPLANT
PENCIL SMOKE ULTRAEVAC 22 CON (MISCELLANEOUS) ×3 IMPLANT
PIN FIXATION 1/8DIA X 3INL (PIN) ×9 IMPLANT
PULSAVAC PLUS IRRIG FAN TIP (DISPOSABLE) ×3
SOL .9 NS 3000ML IRR  AL (IV SOLUTION) ×3
SOL .9 NS 3000ML IRR UROMATIC (IV SOLUTION) ×1 IMPLANT
SOL PREP PVP 2OZ (MISCELLANEOUS) ×3
SOLUTION PREP PVP 2OZ (MISCELLANEOUS) ×1 IMPLANT
SPONGE DRAIN TRACH 4X4 STRL 2S (GAUZE/BANDAGES/DRESSINGS) ×3 IMPLANT
STAPLER SKIN PROX 35W (STAPLE) ×3 IMPLANT
STOCKINETTE IMPERV 14X48 (MISCELLANEOUS) IMPLANT
STRAP TIBIA SHORT (MISCELLANEOUS) ×3 IMPLANT
SUCTION FRAZIER HANDLE 10FR (MISCELLANEOUS) ×3
SUCTION TUBE FRAZIER 10FR DISP (MISCELLANEOUS) ×1 IMPLANT
SUT VIC AB 0 CT1 36 (SUTURE) ×6 IMPLANT
SUT VIC AB 1 CT1 36 (SUTURE) ×6 IMPLANT
SUT VIC AB 2-0 CT2 27 (SUTURE) ×3 IMPLANT
SYR 20ML LL LF (SYRINGE) ×3 IMPLANT
SYR 30ML LL (SYRINGE) ×6 IMPLANT
TIP FAN IRRIG PULSAVAC PLUS (DISPOSABLE) ×1 IMPLANT
TOWEL OR 17X26 4PK STRL BLUE (TOWEL DISPOSABLE) ×3 IMPLANT
TOWER CARTRIDGE SMART MIX (DISPOSABLE) ×3 IMPLANT
TRAY FOLEY MTR SLVR 16FR STAT (SET/KITS/TRAYS/PACK) ×3 IMPLANT
WRAPON POLAR PAD KNEE (MISCELLANEOUS) ×3

## 2019-09-22 NOTE — Anesthesia Preprocedure Evaluation (Signed)
Anesthesia Evaluation  Patient identified by MRN, date of birth, ID band Patient awake    Reviewed: Allergy & Precautions, NPO status , Patient's Chart, lab work & pertinent test results  History of Anesthesia Complications Negative for: history of anesthetic complications  Airway Mallampati: II  TM Distance: >3 FB Neck ROM: Full    Dental no notable dental hx.    Pulmonary neg pulmonary ROS, neg sleep apnea, neg COPD,    breath sounds clear to auscultation- rhonchi (-) wheezing      Cardiovascular Exercise Tolerance: Good (-) hypertension(-) CAD, (-) Past MI, (-) Cardiac Stents and (-) CABG  Rhythm:Regular Rate:Normal - Systolic murmurs and - Diastolic murmurs    Neuro/Psych neg Seizures negative neurological ROS  negative psych ROS   GI/Hepatic Neg liver ROS, GERD  ,  Endo/Other  negative endocrine ROSneg diabetes  Renal/GU negative Renal ROS     Musculoskeletal  (+) Arthritis ,   Abdominal (+) - obese,   Peds  Hematology negative hematology ROS (+)   Anesthesia Other Findings Past Medical History: No date: Arthritis No date: GERD (gastroesophageal reflux disease)   Reproductive/Obstetrics                             Lab Results  Component Value Date   WBC 9.2 09/16/2019   HGB 14.5 09/16/2019   HCT 46.2 (H) 09/16/2019   MCV 87.8 09/16/2019   PLT 208 09/16/2019    Anesthesia Physical Anesthesia Plan  ASA: II  Anesthesia Plan: Spinal   Post-op Pain Management:    Induction:   PONV Risk Score and Plan: 2 and Propofol infusion  Airway Management Planned: Natural Airway  Additional Equipment:   Intra-op Plan:   Post-operative Plan:   Informed Consent: I have reviewed the patients History and Physical, chart, labs and discussed the procedure including the risks, benefits and alternatives for the proposed anesthesia with the patient or authorized representative who has  indicated his/her understanding and acceptance.     Dental advisory given  Plan Discussed with: CRNA and Anesthesiologist  Anesthesia Plan Comments:         Anesthesia Quick Evaluation

## 2019-09-22 NOTE — Op Note (Signed)
OPERATIVE NOTE  DATE OF SURGERY:  09/22/2019  PATIENT NAME:  Paula Joyce   DOB: 1932-07-24  MRN: [id number]  PRE-OPERATIVE DIAGNOSIS: Degenerative arthrosis of the left knee, primary  POST-OPERATIVE DIAGNOSIS:  Same  PROCEDURE:  Left total knee arthroplasty using computer-assisted navigation  SURGEON:  Marciano Sequin. M.D.  ASSISTANT: Cassell Smiles, PA-C (present and scrubbed throughout the case, critical for assistance with exposure, retraction, instrumentation, and closure)  ANESTHESIA: spinal  ESTIMATED BLOOD LOSS: 50 mL  FLUIDS REPLACED: 1600 mL of crystalloid  TOURNIQUET TIME: 85 minutes  DRAINS: 2 medium Hemovac drains  SOFT TISSUE RELEASES: Anterior cruciate ligament, posterior cruciate ligament, deep medial collateral ligament, patellofemoral ligament  IMPLANTS UTILIZED: DePuy Attune size 4 posterior stabilized femoral component (cemented), size 4 rotating platform tibial component (cemented), 35 mm medialized dome patella (cemented), and a 10 mm stabilized rotating platform polyethylene insert.  INDICATIONS FOR SURGERY: Paula Joyce is a 87 y.o. year old female with a long history of progressive knee pain. X-rays demonstrated severe degenerative changes in tricompartmental fashion. The patient had not seen any significant improvement despite conservative nonsurgical intervention. After discussion of the risks and benefits of surgical intervention, the patient expressed understanding of the risks benefits and agree with plans for total knee arthroplasty.   The risks, benefits, and alternatives were discussed at length including but not limited to the risks of infection, bleeding, nerve injury, stiffness, blood clots, the need for revision surgery, cardiopulmonary complications, among others, and they were willing to proceed.  PROCEDURE IN DETAIL: The patient was brought into the operating room and, after adequate spinal anesthesia was achieved, a tourniquet was  placed on the patient's upper thigh. The patient's knee and leg were cleaned and prepped with alcohol and DuraPrep and draped in the usual sterile fashion. A "timeout" was performed as per usual protocol. The lower extremity was exsanguinated using an Esmarch, and the tourniquet was inflated to 300 mmHg. An anterior longitudinal incision was made followed by a standard mid vastus approach. The deep fibers of the medial collateral ligament were elevated in a subperiosteal fashion off of the medial flare of the tibia so as to maintain a continuous soft tissue sleeve. The patella was subluxed laterally and the patellofemoral ligament was incised. Inspection of the knee demonstrated severe degenerative changes with full-thickness loss of articular cartilage. Osteophytes were debrided using a rongeur. Anterior and posterior cruciate ligaments were excised. Two 4.0 mm Schanz pins were inserted in the femur and into the tibia for attachment of the array of trackers used for computer-assisted navigation. Hip center was identified using a circumduction technique. Distal landmarks were mapped using the computer. The distal femur and proximal tibia were mapped using the computer. The distal femoral cutting guide was positioned using computer-assisted navigation so as to achieve a 5 distal valgus cut. The femur was sized and it was felt that a size 4 femoral component was appropriate. A size 4 femoral cutting guide was positioned and the anterior cut was performed and verified using the computer. This was followed by completion of the posterior and chamfer cuts. Femoral cutting guide for the central box was then positioned in the center box cut was performed.  Attention was then directed to the proximal tibia. Medial and lateral menisci were excised. The extramedullary tibial cutting guide was positioned using computer-assisted navigation so as to achieve a 0 varus-valgus alignment and 3 posterior slope. The cut was  performed and verified using the computer. The proximal tibia was  sized and it was felt that a size 4 tibial tray was appropriate. Tibial and femoral trials were inserted followed by insertion of a 10 mm polyethylene insert. This allowed for excellent mediolateral soft tissue balancing both in flexion and in full extension. Finally, the patella was cut and prepared so as to accommodate a 35 mm medialized dome patella. A patella trial was placed and the knee was placed through a range of motion with excellent patellar tracking appreciated. The femoral trial was removed after debridement of posterior osteophytes. The central post-hole for the tibial component was reamed followed by insertion of a keel punch. Tibial trials were then removed. Cut surfaces of bone were irrigated with copious amounts of normal saline with antibiotic solution using pulsatile lavage and then suctioned dry. Polymethylmethacrylate cement was prepared in the usual fashion using a vacuum mixer. Cement was applied to the cut surface of the proximal tibia as well as along the undersurface of a size 4 rotating platform tibial component. Tibial component was positioned and impacted into place. Excess cement was removed using [employer] fast streamer. Cement was then applied to the cut surfaces of the femur as well as along the posterior flanges of the size 4 femoral component. The femoral component was positioned and impacted into place. Excess cement was removed using [employer] fast streamer. A 10 mm polyethylene trial was inserted and the knee was brought into full extension with steady axial compression applied. Finally, cement was applied to the backside of a 35 mm medialized dome patella and the patellar component was positioned and patellar clamp applied. Excess cement was removed using [employer] fast streamer. After adequate curing of the cement, the tourniquet was deflated after a total tourniquet time of 85 minutes. Hemostasis was achieved using electrocautery.  The knee was irrigated with copious amounts of normal saline with antibiotic solution using pulsatile lavage and then suctioned dry. 20 mL of 1.3% Exparel and 60 mL of 0.25% Marcaine in 40 mL of normal saline was injected along the posterior capsule, medial and lateral gutters, and along the arthrotomy site. A 10 mm stabilized rotating platform polyethylene insert was inserted and the knee was placed through a range of motion with excellent mediolateral soft tissue balancing appreciated and excellent patellar tracking noted. 2 medium drains were placed in the wound bed and brought out through separate stab incisions. The medial parapatellar portion of the incision was reapproximated using interrupted sutures of #1 Vicryl. Subcutaneous tissue was approximated in layers using first #0 Vicryl followed #2-0 Vicryl. The skin was approximated with skin staples. A sterile dressing was applied.  The patient tolerated the procedure well and was transported to the recovery room in stable condition.    Rudi Bunyard P. Holley Bouche., M.D.

## 2019-09-22 NOTE — Transfer of Care (Signed)
Immediate Anesthesia Transfer of Care Note  Patient: Paula Joyce  Procedure(s) Performed: COMPUTER ASSISTED TOTAL KNEE ARTHROPLASTY (Left Knee)  Patient Location: PACU  Anesthesia Type:Spinal  Level of Consciousness: sedated  Airway & Oxygen Therapy: Patient Spontanous Breathing and Patient connected to face mask oxygen  Post-op Assessment: Report given to RN and Post -op Vital signs reviewed and stable  Post vital signs: Reviewed and stable  Last Vitals:  Vitals Value Taken Time  BP 130/76 09/22/19 1454  Temp    Pulse 85 09/22/19 1459  Resp 15 09/22/19 1459  SpO2 95 % 09/22/19 1459  Vitals shown include unvalidated device data.  Last Pain:  Vitals:   09/22/19 1012  TempSrc: Temporal  PainSc: 0-No pain         Complications: No apparent anesthesia complications

## 2019-09-22 NOTE — H&P (Signed)
The patient has been re-examined, and the chart reviewed, and there have been no interval changes to the documented history and physical.    The risks, benefits, and alternatives have been discussed at length. The patient expressed understanding of the risks benefits and agreed with plans for surgical intervention.  Nathaneil Feagans P. Ailey Wessling, Jr. M.D.    

## 2019-09-22 NOTE — Anesthesia Procedure Notes (Signed)
Spinal  Start time: 09/22/2019 11:21 AM End time: 09/22/2019 11:26 AM Staffing Performed: resident/CRNA  Anesthesiologist: Emmie Niemann, MD Resident/CRNA: Jerrye Noble, CRNA Preanesthetic Checklist Completed: patient identified, IV checked, site marked, risks and benefits discussed, surgical consent, monitors and equipment checked, pre-op evaluation and timeout performed Spinal Block Patient position: sitting Prep: ChloraPrep Patient monitoring: continuous pulse ox and blood pressure Approach: midline Location: L3-4 Injection technique: single-shot Needle Needle type: Pencil-Tip  Needle gauge: 24 G Additional Notes Negative paresthesia, Negative heme.

## 2019-09-23 NOTE — Progress Notes (Signed)
Pt dangled bilat legs off side of bed.

## 2019-09-23 NOTE — Anesthesia Postprocedure Evaluation (Signed)
Anesthesia Post Note  Patient: Paula Joyce  Procedure(s) Performed: COMPUTER ASSISTED TOTAL KNEE ARTHROPLASTY (Left Knee)  Patient location during evaluation: Nursing Unit Anesthesia Type: Spinal Level of consciousness: awake and alert and oriented Pain management: pain level controlled Vital Signs Assessment: post-procedure vital signs reviewed and stable Respiratory status: spontaneous breathing and respiratory function stable Cardiovascular status: stable Postop Assessment: no headache, no backache, no apparent nausea or vomiting, patient able to bend at knees, adequate PO intake and able to ambulate Anesthetic complications: no     Last Vitals:  Vitals:   09/23/19 0356 09/23/19 0752  BP: (!) 121/54 (!) 154/71  Pulse: (!) 59 62  Resp: 17 20  Temp: 36.7 C 36.6 C  SpO2: 98% 98%    Last Pain:  Vitals:   09/23/19 0935  TempSrc:   PainSc: 5                  Lanora Manis

## 2019-09-23 NOTE — Progress Notes (Signed)
ORTHOPAEDICS PROGRESS NOTE  PATIENT NAME: Paula Joyce DOB: 12-29-1932  MRN: [id number]  POD # 1: Left total knee arthroplasty   Subjective: Patient rested well. Pain has been well controlled. No nausea or vomiting. She tolerated dangling her leg at bedside last PM.  Objective: Vital signs in last 24 hours: Temp:  [97 F (36.1 C)-98.3 F (36.8 C)] 97.9 F (36.6 C) (03/09 0752) Pulse Rate:  [59-94] 62 (03/09 0752) Resp:  [11-29] 20 (03/09 0752) BP: (117-171)/(54-145) 154/71 (03/09 0752) SpO2:  [95 %-100 %] 98 % (03/09 0752) Weight:  [70 kg] 70 kg (03/08 1012)  Intake/Output from previous day: 03/08 0701 - 03/09 0700 In: 3484.8 [P.O.:360; I.V.:2779.4; IV Piggyback:345.4] Out: 1990 [Urine:1700; Drains:240; Blood:50]  No results for input(s): WBC, HGB, HCT, PLT, K, CL, CO2, BUN, CREATININE, GLUCOSE, CALCIUM, LABPT, INR in the last 72 hours.  EXAM General: Well developed, well nourished female in no apparent discomfort Lungs: clear to auscultation Cardiac: normal rate and regular rhythm Abdomen: Soft, nontender. Normal bowel sounds. Left lower extremity: Dressing is dry and intact. Polarcare and hemovac in place and functioning. Homan's test is negative. Patient is able to perform an independent straight leg-raise. Neurologic: Awake, alert, and oriented. Sensory and motor function are grossly intact.   Assessment: Left total knee arthroplasty   Secondary diagnoses: GERD  Plan: Today's goals were reviewed with the patient.  Begin PT and OT as per total knee arthroplasty rehab protocol. Plan is to go Home after hospital stay. DVT Prophylaxis - Lovenox, Foot Pumps and TED hose  Kaiana Marion P. Holley Bouche M.D.

## 2019-09-23 NOTE — Evaluation (Signed)
Occupational Therapy Evaluation Patient Details Name: Paula Joyce MRN: [id number] DOB: 07-Jun-1933 Today's Date: 09/23/2019    History of Present Illness Pt is an 86 yo female diagnosed with degenerative arthrosis of the left knee and is s/p elective L TKA.  PMH includes GERD, DDD, anemia, HTN, TIAs, HLD, hypothyroidism, and arthritis.   Clinical Impression   Patient presents after elective L TKA.  Patient previously I with all (B/I)ADLs and functional mobility, living at an independent living facility.  Patient pleasant and cooperative with treatment.  Verbalized pain due to just completing previous therapy session.  Requested medication.  Provided education on role of OT and goals in acute setting.  Verbalized understanding.  Completed training with patient and daughter on use of adaptive equipment (long handled shoe horn, sock aide, and reacher) to perform self-care tasks while maintaining TKA precautions.  Patient verbalized she has much of the equipment already and has an understanding of how to use them.  Demonstrated understanding with practice at EOB.  Provided further education on compensatory techniques to don/doff compression stockings.  Verbalized understanding.  Patient would benefit from continued occupational therapy to address activity tolerance, use of compensatory techniques, ADL retraining and general safety awareness.  Based on today's performance, recommending [employer] OT for follow up recommendation.      Follow Up Recommendations  Home health OT    Equipment Recommendations  None recommended by OT(Patient has all needed DME)    Recommendations for Other Services       Precautions / Restrictions Precautions Precautions: Knee Restrictions Weight Bearing Restrictions: Yes LLE Weight Bearing: Weight bearing as tolerated Other Position/Activity Restrictions: Per PT eval, no KI required      Mobility Bed Mobility Overal bed mobility: Independent              General bed mobility comments: Demonstrates good ability to pace self and good safety awareness  Transfers Overall transfer level: Needs assistance Equipment used: Rolling walker (2 wheeled) Transfers: Sit to/from Stand Sit to Stand: Min guard         General transfer comment: Demonstrates good carryover with education on sequencing and hand placement    Balance Overall balance assessment: No apparent balance deficits (not formally assessed)                                         ADL either performed or assessed with clinical judgement   ADL Overall ADL's : Needs assistance/impaired     Grooming: Wash/dry hands;Wash/dry face;Oral care;Sitting;Set up           Upper Body Dressing : Set up;Sitting   Lower Body Dressing: Minimal assistance;Sit to/from stand;Cueing for compensatory techniques   Toilet Transfer: Min Fish farm manager Details (indicate cue type and reason): cues for self pacing Toileting- Water quality scientist and Hygiene: Min guard;Sit to/from stand Toileting - Clothing Manipulation Details (indicate cue type and reason): alternating hands on RW for UE support/stability     Functional mobility during ADLs: Min guard;Rolling walker;Cueing for safety General ADL Comments: Demonstrates good follow through with safety and self pacing during functional transfers and ADLs.  patient reports she has a reacher, sock aid and long handled sponge at home.     Vision Patient Visual Report: No change from baseline       Perception     Praxis      Pertinent Vitals/Pain Pain Assessment: 0-10 Pain  Score: 7  Pain Location: L knee Pain Descriptors / Indicators: Aching;Sore Pain Intervention(s): Limited activity within patient's tolerance;Monitored during session;Repositioned;Patient requesting pain meds-RN notified;Ice applied     Hand Dominance Right   Extremity/Trunk Assessment Upper Extremity Assessment Upper Extremity Assessment:  Overall WFL for tasks assessed   Lower Extremity Assessment Lower Extremity Assessment: Defer to PT evaluation   Cervical / Trunk Assessment Cervical / Trunk Assessment: Normal   Communication Communication Communication: No difficulties   Cognition Arousal/Alertness: Awake/alert Behavior During Therapy: WFL for tasks assessed/performed Overall Cognitive Status: Within Functional Limits for tasks assessed                                 General Comments: A&Ox4, pleasant and cooperative   General Comments  Patient able to demonstrate and verbalize understanding of education provided.  Demonstrates good understanding of precautions and safety s/p TKA.    Exercises Other Exercises Other Exercises: Educated pt on role of OT and goals in acute care Other Exercises: Provided education, handout and practice regarding use of adaptive equipment to perform self care Other Exercises: Provided education on use of self pacing, sequencing and body mechanics to improve safety during functional transfers   Shoulder Instructions      Home Living Family/patient expects to be discharged to:: Private residence Living Arrangements: Alone Available Help at Discharge: Family;Available 24 hours/day Type of Home: Independent living facility Home Access: Level entry     Home Layout: One level     Bathroom Shower/Tub: Walk-in shower         Home Equipment: Environmental consultant - 2 wheels;Bedside commode(Pt states she can use BSC for shower chair.)   Additional Comments: Pt has call bells for staff assistance in bathroom and bedroom; daughter will be staying with pt for the first 10 days upon discharge home      Prior Functioning/Environment Level of Independence: Independent with assistive device(s)        Comments: Patient was I with all ADLs.  Due to COVID, pt's family would assist grocery shopping and community tasks.  Patient uses no DME for functional mobility within home,  occasionally uses walking stick for long distances        OT Problem List: Decreased activity tolerance;Decreased knowledge of precautions;Decreased knowledge of use of DME or AE;Pain      OT Treatment/Interventions: Self-care/ADL training;Therapeutic exercise;Energy conservation;DME and/or AE instruction;Therapeutic activities;Patient/family education    OT Goals(Current goals can be found in the care plan section) Acute Rehab OT Goals Patient Stated Goal: "Be independent again" OT Goal Formulation: With patient Time For Goal Achievement: 10/07/19 Potential to Achieve Goals: Good  OT Frequency: Min 1X/week   Barriers to D/C:            Co-evaluation              AM-PAC OT "6 Clicks" Daily Activity     Outcome Measure Help from another person eating meals?: None Help from another person taking care of personal grooming?: None Help from another person toileting, which includes using toliet, bedpan, or urinal?: A Little Help from another person bathing (including washing, rinsing, drying)?: A Lot Help from another person to put on and taking off regular upper body clothing?: None Help from another person to put on and taking off regular lower body clothing?: A Little 6 Click Score: 20   End of Session Equipment Utilized During Treatment: Rolling walker Nurse Communication: Patient requests  pain meds  Activity Tolerance: Patient limited by pain(Patient verbalizing increased pain as she had just completed PT session) Patient left: in bed;with call bell/phone within reach;with bed alarm set;with family/visitor present  OT Visit Diagnosis: Unsteadiness on feet (R26.81);Other (comment)(L TKA)                Time: [id number] OT Time Calculation (min): 22 min Charges:  OT General Charges $OT Visit: 1 Visit OT Evaluation $OT Eval Low Complexity: 1 Low OT Treatments $Self Care/Home Management : 8-22 mins  Baldomero Lamy, MS, OTR/L 09/23/19, 3:16 PM

## 2019-09-23 NOTE — TOC Benefit Eligibility Note (Signed)
Transition of Care [facility]) Benefit Eligibility Note    Patient Details  Name: Paula Joyce MRN: [id number] Date of Birth: 1933-04-27   Medication/Dose: Enoxaparin 40mg  once daily for 14 days  Covered?: Yes  Tier: Other(Tier 4)  Prescription Coverage Preferred Pharmacy: [place] with Person/Company/Phone Number:: [phone] with Manson Passey at [phone]  Co-Pay: $90 estimated copay  Prior Approval: No  Deductible: (No deductible.)   Dannette Barbara Phone Number: [phone] or [phone] 09/23/2019, 12:44 PM

## 2019-09-23 NOTE — Evaluation (Signed)
Physical Therapy Evaluation Patient Details Name: Paula Joyce MRN: [id number] DOB: 1932/10/20 Today's Date: 09/23/2019   History of Present Illness  Pt is an 86 yo female diagnosed with degenerative arthrosis of the left knee and is s/p elective L TKA.  PMH includes GERD, DDD, anemia, HTN, TIAs, HLD, hypothyroidism, and arthritis.    Clinical Impression  Pt pleasant and motivated to participate during the session and performed very well overall.  Pt was Ind with bed mobility tasks and demonstrated good eccentric and concentric control with transfers.  Pt was steady with gait and progressed from step-to pattern to step-through with cues for sequencing.  Pt reported no adverse symptoms during the session other than mild L knee pain with SpO2 and HR WNL throughout.  Pt is expected to make very good progress towards goals in acute care and will benefit from HHPT services upon discharge to safely address deficits listed in patient problem list for decreased caregiver assistance and eventual return to PLOF.       Follow Up Recommendations Home health PT;Supervision for mobility/OOB    Equipment Recommendations  None recommended by PT    Recommendations for Other Services       Precautions / Restrictions Precautions Precautions: Knee Restrictions Weight Bearing Restrictions: Yes LLE Weight Bearing: Weight bearing as tolerated Other Position/Activity Restrictions: Pt able to perform LLE SLRs without knee extensor lag, no KI required      Mobility  Bed Mobility Overal bed mobility: Independent             General bed mobility comments: Pt able to go from sup to sit with very good speed and control  Transfers Overall transfer level: Needs assistance Equipment used: Rolling walker (2 wheeled) Transfers: Sit to/from Stand Sit to Stand: Min guard         General transfer comment: Sit to/from stand training from multiple height surfaces with mod verbal and visual cues for  proper sequencing  Ambulation/Gait Ambulation/Gait assistance: Min guard Gait Distance (Feet): 60 Feet x 1, 15 Feet x 1 Assistive device: Rolling walker (2 wheeled) Gait Pattern/deviations: Step-to pattern;Step-through pattern;Decreased step length - right;Decreased step length - left Gait velocity: decreased   General Gait Details: Step-to pattern gradually progressed to step-through pattern with cues with good stability and no buckling  Stairs            Wheelchair Mobility    Modified Rankin (Stroke Patients Only)       Balance Overall balance assessment: No apparent balance deficits (not formally assessed)                                           Pertinent Vitals/Pain Pain Assessment: 0-10 Pain Score: 2  Pain Location: L knee Pain Descriptors / Indicators: Aching;Sore Pain Intervention(s): Monitored during session;Premedicated before session    [place] expects to be discharged to:: Private residence Living Arrangements: Alone Available Help at Discharge: Family;Available 24 hours/day Type of Home: Independent living facility Home Access: Level entry     Home Layout: One level Home Equipment: Walker - 2 wheels;Bedside commode Additional Comments: Pt has call bells for staff assistance in bathroom and bedroom; daughter will be staying with pt for the first 10 days upon discharge home    Prior Function Level of Independence: Independent with assistive device(s)         Comments: Pt Ind with  amb in home without an AD and Mod Ind with community ambulation with a walking stick, no fall history, Ind with ADLs     Hand Dominance        Extremity/Trunk Assessment   Upper Extremity Assessment Upper Extremity Assessment: Overall WFL for tasks assessed    Lower Extremity Assessment Lower Extremity Assessment: Generalized weakness;LLE deficits/detail LLE Deficits / Details: Pt able to perform LLE Ind SLRs without knee  extensor lag and BLE ankle DF/PF strength and AROM WNL LLE: Unable to fully assess due to pain LLE Sensation: WNL       Communication   Communication: No difficulties  Cognition Arousal/Alertness: Awake/alert Behavior During Therapy: WFL for tasks assessed/performed Overall Cognitive Status: Within Functional Limits for tasks assessed                                        General Comments      Exercises Total Joint Exercises Ankle Circles/Pumps: AROM;Strengthening;Both;5 reps;10 reps Quad Sets: AROM;Strengthening;Both;10 reps;5 reps Hip ABduction/ADduction: AROM;Both;10 reps Straight Leg Raises: AROM;Both;10 reps Long Arc Quad: AROM;Strengthening;Both;10 reps;15 reps Knee Flexion: AROM;Strengthening;Both;10 reps;15 reps Goniometric ROM: L knee AROM: 0-75 Marching in Standing: AROM;Both;10 reps;Standing Other Exercises Other Exercises: HEP education/review per handout Other Exercises: Positioning education to promote L knee ext PROM   Assessment/Plan    PT Assessment Patient needs continued PT services  PT Problem List Decreased strength;Decreased range of motion;Decreased activity tolerance;Decreased knowledge of use of DME;Decreased balance;Pain       PT Treatment Interventions Therapeutic exercise;DME instruction;Gait training;Stair training;Functional mobility training;Therapeutic activities;Patient/family education;Balance training    PT Goals (Current goals can be found in the Care Plan section)  Acute Rehab PT Goals Patient Stated Goal: To walk with less pain PT Goal Formulation: With patient Time For Goal Achievement: 10/06/19 Potential to Achieve Goals: Good    Frequency BID   Barriers to discharge        Co-evaluation               AM-PAC PT "6 Clicks" Mobility  Outcome Measure Help needed turning from your back to your side while in a flat bed without using bedrails?: None Help needed moving from lying on your back to sitting  on the side of a flat bed without using bedrails?: None Help needed moving to and from a bed to a chair (including a wheelchair)?: A Little Help needed standing up from a chair using your arms (e.g., wheelchair or bedside chair)?: A Little Help needed to walk in hospital room?: A Little Help needed climbing 3-5 steps with a railing? : A Little 6 Click Score: 20    End of Session Equipment Utilized During Treatment: Gait belt Activity Tolerance: Patient tolerated treatment well Patient left: in chair;with call bell/phone within reach;with chair alarm set;with SCD's reapplied;Other (comment)(polar care donned to L knee) Nurse Communication: Mobility status PT Visit Diagnosis: Other abnormalities of gait and mobility (R26.89);Muscle weakness (generalized) (M62.81);Pain Pain - Right/Left: Left Pain - part of body: Knee    Time: [id number] PT Time Calculation (min) (ACUTE ONLY): 49 min   Charges:   PT Evaluation $PT Eval Moderate Complexity: 1 Mod PT Treatments $Gait Training: 8-22 mins $Therapeutic Exercise: 8-22 mins        D. Scott Keigan Tafoya PT, DPT 09/23/19, 11:10 AM

## 2019-09-23 NOTE — TOC Initial Note (Signed)
Transition of Care [facility]) - Initial/Assessment Note    Patient Details  Name: Paula Joyce MRN: [id number] Date of Birth: [birth year]  [facility] CM/SW Contact:    Su Hilt, RN Phone Number: 09/23/2019, 2:27 PM  Clinical Narrative:                 Met with the patient at the bedside to discuss DC plan and needs She lives alone but her adult daughter will be staying with her for a while to help during recovery She has all the DME she would need at home including BSC, RW and grab bars, she is set [facility] with [facility] The price of the Lovenox is 90$, she can afford her medications and is up to date with her PCP Will continue to follow for additional needs  Expected Discharge Plan: [place] Barriers to Discharge: Continued Medical Work up   Patient Goals and CMS Choice Patient states their goals for this hospitalization and ongoing recovery are:: go home      Expected Discharge Plan and Services Expected Discharge Plan: [place]   Discharge Planning Services: CM Consult   Living arrangements for the past 2 months: Single Family Home                 DME Arranged: N/A         HH Arranged: PT HH Agency: [facility] [employer] Date [place]: 09/23/19 Time HH Agency Contacted: 4 Representative spoke with at [place]: Helene Kelp  Prior Living Arrangements/Services Living arrangements for the past 2 months: [place] Lives with:: Adult Children(Lives alone but daughter will be staying with her for a couple of weeks) Patient language and need for interpreter reviewed:: Yes Do you feel safe going back to the place where you live?: Yes      Need for Family Participation in Patient Care: No (Comment) Care giver support system in place?: Yes (comment) Current home services: DME(RW, BSC, Grab bars) Criminal Activity/Legal Involvement Pertinent to Current  Situation/Hospitalization: No - Comment as needed  Activities of Daily Living Home Assistive Devices/Equipment: Eyeglasses, Hearing aid, Cane (specify quad or straight) ADL Screening (condition at time of admission) Patient's cognitive ability adequate to safely complete daily activities?: Yes Is the patient deaf or have difficulty hearing?: Yes Does the patient have difficulty seeing, even when wearing glasses/contacts?: No Does the patient have difficulty concentrating, remembering, or making decisions?: No Patient able to express need for assistance with ADLs?: Yes Does the patient have difficulty dressing or bathing?: No Independently performs ADLs?: Yes (appropriate for developmental age) Does the patient have difficulty walking or climbing stairs?: No Weakness of Legs: None Weakness of Arms/Hands: None  Permission Sought/Granted                  Emotional Assessment Appearance:: Appears stated age Attitude/Demeanor/Rapport: Engaged Affect (typically observed): Appropriate Orientation: : Oriented to Self, Oriented to Place, Oriented to  Time, Oriented to Situation Alcohol / Substance Use: Not Applicable    Admission diagnosis:  Total knee replacement status [Z96.659] Patient Active Problem List   Diagnosis Date Noted  . Total knee replacement status 09/22/2019   PCP:  Derinda Late, MD Pharmacy:   [facility], [place] - [address] [address] Phone: [phone] Fax: [phone]     Social Determinants of Health (SDOH) Interventions  Readmission Risk Interventions No flowsheet data found.

## 2019-09-23 NOTE — TOC Progression Note (Signed)
Transition of Care [facility]) - Progression Note    Patient Details  Name: Paula Joyce MRN: [id number] Date of Birth: [birth year]  Transition of Care [facility]) CM/SW [place], RN Phone Number: 09/23/2019, 12:08 PM  Clinical Narrative:    Requested the price of lovenox 40 mg daily X 14 days        Expected Discharge Plan and Services                                                 Social Determinants of Health (SDOH) Interventions    Readmission Risk Interventions No flowsheet data found.

## 2019-09-23 NOTE — Progress Notes (Signed)
Physical Therapy Treatment Patient Details Name: Paula Joyce MRN: [id number] DOB: 1933-03-02 Today's Date: 09/23/2019    History of Present Illness Pt is an 86 yo female diagnosed with degenerative arthrosis of the left knee and is s/p elective L TKA.  PMH includes GERD, DDD, anemia, HTN, TIAs, HLD, hypothyroidism, and arthritis.    PT Comments    Pt pleasant and motivated to participate during the session.  Pt continued to perform very well with PT requiring no assistance with bed mobility tasks and demonstrating good control and stability with transfers from various height surfaces.  Pt increased amb distance to 150' this session with no adverse symptoms other than mild L knee pain and with SpO2 and HR WNL.  Pt should continue to make very good progress while in acute care with stair training planned for next session.  Pt will benefit from HHPT services upon discharge to safely address deficits listed in patient problem list for decreased caregiver assistance and eventual return to PLOF.     Follow Up Recommendations  Home health PT;Supervision for mobility/OOB     Equipment Recommendations  None recommended by PT    Recommendations for Other Services       Precautions / Restrictions Precautions Precautions: Knee Restrictions Weight Bearing Restrictions: Yes LLE Weight Bearing: Weight bearing as tolerated Other Position/Activity Restrictions: Pt able to perform LLE SLRs without knee extensor lag, no KI required    Mobility  Bed Mobility Overal bed mobility: Independent             General bed mobility comments: Pt able to go from sit to sup with very good speed and control  Transfers Overall transfer level: Needs assistance Equipment used: Rolling walker (2 wheeled) Transfers: Sit to/from Stand Sit to Stand: Min guard         General transfer comment: Sit to/from stand training from multiple height surfaces with min verbal cues for proper  sequencing  Ambulation/Gait Ambulation/Gait assistance: Min guard Gait Distance (Feet): 150 Feet Assistive device: Rolling walker (2 wheeled) Gait Pattern/deviations: Step-through pattern;Decreased step length - right;Decreased step length - left;Antalgic Gait velocity: decreased   General Gait Details: Mildly antalgic gait pattern on the LLE but good control and stability with improved cadence   Stairs             Wheelchair Mobility    Modified Rankin (Stroke Patients Only)       Balance Overall balance assessment: No apparent balance deficits (not formally assessed)                                          Cognition Arousal/Alertness: Awake/alert Behavior During Therapy: WFL for tasks assessed/performed Overall Cognitive Status: Within Functional Limits for tasks assessed                                        Exercises Total Joint Exercises Ankle Circles/Pumps: AROM;Strengthening;Both;10 reps Quad Sets: AROM;Strengthening;Both;10 reps Gluteal Sets: Strengthening;Both;10 reps Hip ABduction/ADduction: AROM;Both;10 reps Straight Leg Raises: AROM;Both;10 reps Long Arc Quad: AROM;Strengthening;Both;10 reps;15 reps Knee Flexion: AROM;Strengthening;Both;10 reps;15 reps Goniometric ROM: L knee AROM: 0-75 Marching in Standing: AROM;Both;10 reps;Standing Other Exercises Other Exercises: HEP education/review per handout with pt and daughter Other Exercises: Positioning education to promote L knee ext PROM and prevent heel pressure with  pt and daughter Other Exercises: Musician transfer sequencing education with verbal instruction and visual demonstration using room chair for simulation with pt and daughter    General Comments        Pertinent Vitals/Pain Pain Assessment: No/denies pain Pain Score: 2  Pain Location: L knee Pain Descriptors / Indicators: Aching;Sore Pain Intervention(s): Premedicated before session;Monitored during  session    [place] expects to be discharged to:: Private residence Living Arrangements: Alone Available Help at Discharge: Family;Available 24 hours/day Type of Home: Independent living facility Home Access: Level entry   Home Layout: One level Home Equipment: Walker - 2 wheels;Bedside commode Additional Comments: Pt has call bells for staff assistance in bathroom and bedroom; daughter will be staying with pt for the first 10 days upon discharge home    Prior Function Level of Independence: Independent with assistive device(s)      Comments: Pt Ind with amb in home without an AD and Mod Ind with community ambulation with a walking stick, no fall history, Ind with ADLs   PT Goals (current goals can now be found in the care plan section) Acute Rehab PT Goals Patient Stated Goal: To walk with less pain PT Goal Formulation: With patient Time For Goal Achievement: 10/06/19 Potential to Achieve Goals: Good Progress towards PT goals: Progressing toward goals    Frequency    BID      PT Plan Current plan remains appropriate    Co-evaluation              AM-PAC PT "6 Clicks" Mobility   Outcome Measure  Help needed turning from your back to your side while in a flat bed without using bedrails?: None Help needed moving from lying on your back to sitting on the side of a flat bed without using bedrails?: None Help needed moving to and from a bed to a chair (including a wheelchair)?: A Little Help needed standing up from a chair using your arms (e.g., wheelchair or bedside chair)?: A Little Help needed to walk in hospital room?: A Little Help needed climbing 3-5 steps with a railing? : A Little 6 Click Score: 20    End of Session Equipment Utilized During Treatment: Gait belt Activity Tolerance: Patient tolerated treatment well Patient left: in bed;with call bell/phone within reach;with family/visitor present;with bed alarm set;Other (comment)(Polar care  to L knee; SCDs left off with OT preparing to enter for session) Nurse Communication: Mobility status PT Visit Diagnosis: Other abnormalities of gait and mobility (R26.89);Muscle weakness (generalized) (M62.81);Pain Pain - Right/Left: Left Pain - part of body: Knee     Time: [id number] PT Time Calculation (min) (ACUTE ONLY): 41 min  Charges:  $Gait Training: 8-22 mins $Therapeutic Exercise: 8-22 mins $Therapeutic Activity: 8-22 mins                      D. Scott Jalena Vanderlinden PT, DPT 09/23/19, 2:46 PM

## 2019-09-24 MED ORDER — OXYCODONE HCL 5 MG PO TABS: 5.0000 mg | ORAL_TABLET | ORAL | 0 refills | Status: AC | PRN

## 2019-09-24 MED ORDER — CELECOXIB 200 MG PO CAPS: 200.0000 mg | ORAL_CAPSULE | Freq: Two times a day (BID) | ORAL | 0 refills | Status: AC

## 2019-09-24 MED ORDER — ENOXAPARIN SODIUM 40 MG/0.4ML ~~LOC~~ SOLN: 40.0000 mg | SUBCUTANEOUS | 0 refills | Status: AC

## 2019-09-24 MED ORDER — TRAMADOL HCL 50 MG PO TABS: 50.0000 mg | ORAL_TABLET | ORAL | 0 refills | Status: AC | PRN

## 2019-09-24 NOTE — Discharge Summary (Signed)
Physician Discharge Summary  Patient ID: Paula Joyce MRN: [id number] DOB/AGE: July 12, 1933 86 y.o.  Admit date: 09/22/2019 Discharge date: 09/24/2019  Admission Diagnoses:  Total knee replacement status [Z96.659]  Surgeries:Procedure(s): Dereck Leep, Jr. M.D.  ASSISTANT: Cassell Smiles, PA-C (present and scrubbed throughout the case, critical for assistance with exposure, retraction, instrumentation, and closure)  ANESTHESIA: spinal  ESTIMATED BLOOD LOSS: 50 mL  FLUIDS REPLACED: 1600 mL of crystalloid  TOURNIQUET TIME: 85 minutes  DRAINS: 2 medium Hemovac drains  SOFT TISSUE RELEASES: Anterior cruciate ligament, posterior cruciate ligament, deep medial collateral ligament, patellofemoral ligament  IMPLANTS UTILIZED: DePuy Attune size 4 posterior stabilized femoral component (cemented), size 4 rotating platform tibial component (cemented), 35 mm medialized dome patella (cemented), and a 10 mm stabilized rotating platform polyethylene insert.  Discharge Diagnoses: Patient Active Problem List   Diagnosis Date Noted  . Total knee replacement status 09/22/2019    Past Medical History:  Diagnosis Date  . Arthritis   . GERD (gastroesophageal reflux disease)      Transfusion:    Consultants (if any):   Discharged Condition: Improved  Hospital Course: Paula Joyce is an 86 y.o. female who was admitted 09/22/2019 with a diagnosis of left knee osteoarthritis and went to the operating room on 09/22/2019 and underwent left total knee arthroplasty. The patient received perioperative antibiotics for prophylaxis (see below). The patient tolerated the procedure well and was transported to PACU in stable condition. After meeting PACU criteria, the patient was subsequently transferred to the Orthopaedics/Rehabilitation unit.   The patient received DVT prophylaxis in the form of early mobilization, Lovenox, Foot Pumps and TED hose. A sacral pad had been placed and heels were  elevated off of the bed with rolled towels in order to protect skin integrity. Foley catheter was discontinued on postoperative day #1. Wound drains were discontinued on postoperative day #2. The surgical incision was healing well without signs of infection.  Physical therapy was initiated postoperatively for transfers, gait training, and strengthening. Occupational therapy was initiated for activities of daily living and evaluation for assisted devices. Rehabilitation goals were reviewed in detail with the patient. The patient made steady progress with physical therapy and physical therapy recommended discharge to Home.   The patient achieved her preliminary goals of this hospitalization and was felt to be medically and orthopaedically appropriate for discharge.  She was given perioperative antibiotics:  Anti-infectives (From admission, onward)   Start     Dose/Rate Route Frequency Ordered Stop   09/22/19 1730  ceFAZolin (ANCEF) IVPB 2g/100 mL premix     2 g 200 mL/hr over 30 Minutes Intravenous Every 6 hours 09/22/19 1616 09/23/19 1153   09/22/19 1002  ceFAZolin (ANCEF) 2-4 GM/100ML-% IVPB    Note to Pharmacy: Register, Karen   : cabinet override      09/22/19 1002 09/23/19 0105   09/22/19 1000  ceFAZolin (ANCEF) IVPB 2g/100 mL premix     2 g 200 mL/hr over 30 Minutes Intravenous On call to O.R. 09/22/19 [id number] 09/22/19 1128    .  Recent vital signs:  Vitals:   09/23/19 1148 09/23/19 2358  BP: 128/68 138/75  Pulse: 72 76  Resp: 20 17  Temp: (!) 97.5 F (36.4 C) 97.6 F (36.4 C)  SpO2: 98% 95%    Recent laboratory studies:  No results for input(s): WBC, HGB, HCT, PLT, K, CL, CO2, BUN, CREATININE, GLUCOSE, CALCIUM, LABPT, INR in the last 72 hours.  Diagnostic Studies: DG Knee Left Port  Result  Date: 09/22/2019 CLINICAL DATA:  Status post knee replacement EXAM: PORTABLE LEFT KNEE - 2 VIEW COMPARISON:  None. FINDINGS: Left knee prosthesis is noted. Surgical drains are noted in place.  Lucency is noted along the posterior femoral cortex just above the prosthesis. This is felt to be artifactual in nature. No definitive cortical irregularity is seen. IMPRESSION: Status post left knee replacement as described. Electronically Signed   By: Inez Catalina M.D.   On: 09/22/2019 15:36    Discharge Medications:   Allergies as of 09/24/2019      Reactions   Latex Itching, Other (See Comments)   Redness/itching   Adhesive [tape] Itching, Other (See Comments)   Redness/itchness   Pitavastatin Other (See Comments)   Rosuvastatin Other (See Comments)   Simvastatin Other (See Comments)      Medication List    TAKE these medications   acetaminophen 500 MG tablet Commonly known as: TYLENOL Take 500 mg by mouth every 6 (six) hours as needed.   bismuth subsalicylate [id number] suspension Commonly known as: PEPTO BISMOL Take 30 mLs by mouth every 6 (six) hours as needed.   celecoxib 200 MG capsule Commonly known as: CELEBREX Take 1 capsule (200 mg total) by mouth 2 (two) times daily.   enoxaparin 40 MG/0.4ML injection Commonly known as: LOVENOX Inject 0.4 mLs (40 mg total) into the skin daily for 14 days.   ezetimibe 10 MG tablet Commonly known as: ZETIA Take 10 mg by mouth at bedtime.   levothyroxine 112 MCG tablet Commonly known as: SYNTHROID Take 112 mcg by mouth at bedtime.   Melatonin 5 MG Caps Take by mouth.   oxyCODONE 5 MG immediate release tablet Commonly known as: Oxy IR/ROXICODONE Take 1 tablet (5 mg total) by mouth every 4 (four) hours as needed for moderate pain (pain score 4-6).   traMADol 50 MG tablet Commonly known as: ULTRAM Take 1 tablet (50 mg total) by mouth every 4 (four) hours as needed for moderate pain.            Durable Medical Equipment  (From admission, onward)         Start     Ordered   09/22/19 1617  DME Walker rolling  Once    Question:  Patient needs a walker to treat with the following condition  Answer:  Total knee  replacement status   09/22/19 1616   09/22/19 1617  DME Bedside commode  Once    Question:  Patient needs a bedside commode to treat with the following condition  Answer:  Total knee replacement status   09/22/19 1616          Disposition: Home with home health physical therapy    Follow-up Information    [facility] On 10/07/2019.   Specialty: Orthopedic Surgery Why: at 1:15pm Contact information: [place] [address] [phone]        Dereck Leep, MD On 11/04/2019.   Specialty: Orthopedic Surgery Why: at 3:00pm Contact information: [place] [address] [place], PA-C 09/24/2019, 7:02 AM

## 2019-09-24 NOTE — Progress Notes (Signed)
Physical Therapy Treatment Patient Details Name: Paula Joyce MRN: [id number] DOB: 1933/03/17 Today's Date: 09/24/2019    History of Present Illness Pt is an 86 yo female diagnosed with degenerative arthrosis of the left knee and is s/p elective L TKA.  PMH includes GERD, DDD, anemia, HTN, TIAs, HLD, hypothyroidism, and arthritis.    PT Comments    Pt pleasant and motivated to participate during the session.  Pt continued to perform very well and made good progress towards goals this session.  Pt was able flex her L knee past 90 deg with minimal pain this session.  Pt demonstrated very good eccentric and concentric control with transfers and was steady with improved cadence with ambulation.  Pt ascended and descended 4 steps with good carryover of proper sequencing and was steady without L knee buckling.  Pt will benefit from HHPT services upon discharge to safely address deficits listed in patient problem list for decreased caregiver assistance and eventual return to PLOF.      Follow Up Recommendations  Home health PT;Supervision for mobility/OOB     Equipment Recommendations  None recommended by PT    Recommendations for Other Services       Precautions / Restrictions Precautions Precautions: Knee Precaution Booklet Issued: Yes (comment) Restrictions Weight Bearing Restrictions: Yes LLE Weight Bearing: Weight bearing as tolerated    Mobility  Bed Mobility               General bed mobility comments: NT, pt in recliner  Transfers Overall transfer level: Needs assistance Equipment used: Rolling walker (2 wheeled) Transfers: Sit to/from Stand Sit to Stand: Supervision         General transfer comment: Min verbal cues for general sequencing  Ambulation/Gait Ambulation/Gait assistance: Supervision Gait Distance (Feet): 200 Feet x 1, 150 Feet x 1 Assistive device: Rolling walker (2 wheeled) Gait Pattern/deviations: Step-through pattern;Decreased step  length - right;Decreased step length - left;Antalgic     General Gait Details: Mildly antalgic gait pattern on the LLE but good control, stability, and cadence   Stairs Stairs: Yes Stairs assistance: Min guard Stair Management: Two rails;Step to pattern Number of Stairs: 4 General stair comments: Min verbal and visual cues for sequencing with good control and stability   Wheelchair Mobility    Modified Rankin (Stroke Patients Only)       Balance Overall balance assessment: No apparent balance deficits (not formally assessed)                                          Cognition Arousal/Alertness: Awake/alert Behavior During Therapy: WFL for tasks assessed/performed Overall Cognitive Status: Within Functional Limits for tasks assessed                                        Exercises Total Joint Exercises Ankle Circles/Pumps: AROM;Strengthening;Both;10 reps Quad Sets: AROM;Strengthening;10 reps;15 reps;Left Straight Leg Raises: AROM;Both;10 reps Long Arc Quad: AROM;Strengthening;10 reps;15 reps;Left Knee Flexion: AROM;Strengthening;10 reps;15 reps;Left Goniometric ROM: L knee AROM: 0-92 deg Marching in Standing: AROM;Both;10 reps;Standing Other Exercises Other Exercises: 90 deg L turn training x 4 to prevent CKC twisting on the L knee Other Exercises: Car transfer sequencing review    General Comments        Pertinent Vitals/Pain Pain Assessment: 0-10 Pain Score: 2  Pain Location: L knee Pain Descriptors / Indicators: Aching;Sore Pain Intervention(s): Premedicated before session;Monitored during session    Home Living                      Prior Function            PT Goals (current goals can now be found in the care plan section) Progress towards PT goals: Progressing toward goals    Frequency    BID      PT Plan Current plan remains appropriate    Co-evaluation              AM-PAC PT "6 Clicks"  Mobility   Outcome Measure  Help needed turning from your back to your side while in a flat bed without using bedrails?: None Help needed moving from lying on your back to sitting on the side of a flat bed without using bedrails?: None Help needed moving to and from a bed to a chair (including a wheelchair)?: A Little Help needed standing up from a chair using your arms (e.g., wheelchair or bedside chair)?: A Little Help needed to walk in hospital room?: A Little Help needed climbing 3-5 steps with a railing? : A Little 6 Click Score: 20    End of Session Equipment Utilized During Treatment: Gait belt Activity Tolerance: Patient tolerated treatment well Patient left: in chair;with call bell/phone within reach(Polar care on L knee) Nurse Communication: Mobility status PT Visit Diagnosis: Other abnormalities of gait and mobility (R26.89);Muscle weakness (generalized) (M62.81);Pain Pain - Right/Left: Left Pain - part of body: Knee     Time: [id number] PT Time Calculation (min) (ACUTE ONLY): 28 min  Charges:  $Gait Training: 8-22 mins $Therapeutic Exercise: 8-22 mins                     D. Scott Jak Haggar PT, DPT 09/24/19, 10:38 AM

## 2019-09-24 NOTE — Progress Notes (Signed)
  Subjective: 2 Days Post-Op Procedure(s) (LRB): COMPUTER ASSISTED TOTAL KNEE ARTHROPLASTY (Left) Patient reports pain as well-controlled.   Patient is well, and has had no acute complaints or problems Plan is to go Home after hospital stay. Negative for chest pain and shortness of breath Fever: no Gastrointestinal: negative for nausea and vomiting.  Patient has had a bowel movement.  Objective: Vital signs in last 24 hours: Temp:  [97.5 F (36.4 C)-97.9 F (36.6 C)] 97.6 F (36.4 C) (03/09 2358) Pulse Rate:  [62-76] 76 (03/09 2358) Resp:  [17-20] 17 (03/09 2358) BP: (128-154)/(68-75) 138/75 (03/09 2358) SpO2:  [95 %-98 %] 95 % (03/09 2358)  Intake/Output from previous day:  Intake/Output Summary (Last 24 hours) at 09/24/2019 0656 Last data filed at 09/23/2019 1837 Gross per 24 hour  Intake 1016 ml  Output --  Net 1016 ml    Intake/Output this shift: No intake/output data recorded.  Labs: No results for input(s): HGB in the last 72 hours. No results for input(s): WBC, RBC, HCT, PLT in the last 72 hours. No results for input(s): NA, K, CL, CO2, BUN, CREATININE, GLUCOSE, CALCIUM in the last 72 hours. No results for input(s): LABPT, INR in the last 72 hours.   EXAM General - Patient is Alert, Appropriate and Oriented Extremity - Neurovascular intact Dorsiflexion/Plantar flexion intact Compartment soft Dressing/Incision -Postoperative dressing remains in place., Polar Care in place and working. , Hemovac in place.  Following postoperative dressing removal, minimal sanguinous drainage noted. Motor Function - intact, moving foot and toes well on exam.  Able to perform straight leg raise without difficulty. Cardiovascular- Regular rate and rhythm, no murmurs/rubs/gallops Respiratory- Lungs clear to auscultation bilaterally Gastrointestinal- soft and nontender   Assessment/Plan: 2 Days Post-Op Procedure(s) (LRB): COMPUTER ASSISTED TOTAL KNEE ARTHROPLASTY (Left) Active  Problems:   Total knee replacement status  Estimated body mass index is 26.49 kg/m as calculated from the following:   Height as of this encounter: 5\' 4"  (1.626 m).   Weight as of this encounter: 70 kg. Advance diet Up with therapy Discharge home with home health  Postoperative dressing removed.  Fresh honeycomb dressing placed.  Hemovac removed.  DVT Prophylaxis - Lovenox, Ted hose and foot pumps Weight-Bearing as tolerated to left leg  Cassell Smiles, PA-C [facility] 09/24/2019, 6:56 AM

## 2019-09-24 NOTE — TOC Progression Note (Signed)
Transition of Care [facility]) - Progression Note    Patient Details  Name: Paula Joyce MRN: [id number] Date of Birth: 1933/03/19  Transition of Care [facility]) CM/SW [place], RN Phone Number: 09/24/2019, 8:33 AM  Clinical Narrative:     The patient lives at [facility] independent living and wants to use [place] [facility], I called Seth Bake at [facility] and asked what kind of orders will be needed, she said she needs Outpatient eval and treat order for PT/OT and to be faxed to [phone], I requested the order from the physician and PA.  Will fax once obtained  Expected Discharge Plan: [place] Barriers to Discharge: Continued Medical Work up  Expected Discharge Plan and Services Expected Discharge Plan: [place]   Discharge Planning Services: CM Consult   Living arrangements for the past 2 months: Single Family Home Expected Discharge Date: 09/24/19               DME Arranged: N/A         HH Arranged: PT [place]: [facility] [employer] Date [place]: 09/23/19 Time [place]: [id number] Representative spoke with at [place]: [place] ([place]) Interventions    Readmission Risk Interventions No flowsheet data found.

## 2019-09-24 NOTE — Progress Notes (Signed)
Discharge paper reviewed with pt and daughter both expressed understanding all questions and concerns were addressed.  Dressing to L knee was changed along with hemovac site dressing pt tolerated well and expressed understanding on how and when to change if needed. Bilateral knee high TEDS on at time of d/c.  IV removed from R wrist without issue.  All belongings packed and taken at time of discharge.  Pt was wheeled down to medical mall and assisted into daughters car by this nurse.  Skin warm dry clean and intact, VS WNL see flow sheets.

## 2019-09-25 DIAGNOSIS — R2689 Other abnormalities of gait and mobility: Secondary | ICD-10-CM | POA: Diagnosis not present

## 2019-09-25 DIAGNOSIS — Z96652 Presence of left artificial knee joint: Secondary | ICD-10-CM | POA: Diagnosis not present

## 2019-09-25 DIAGNOSIS — M6281 Muscle weakness (generalized): Secondary | ICD-10-CM | POA: Diagnosis not present

## 2019-09-26 DIAGNOSIS — Z20822 Contact with and (suspected) exposure to covid-19: Secondary | ICD-10-CM | POA: Diagnosis not present

## 2019-09-26 DIAGNOSIS — Z20828 Contact with and (suspected) exposure to other viral communicable diseases: Secondary | ICD-10-CM | POA: Diagnosis not present

## 2019-10-01 ENCOUNTER — Ambulatory Visit
Admission: RE | Admit: 2019-10-01 | Discharge: 2019-10-01 | Disposition: A | Discharge status: Home / Self Care | Payer: PPO | Source: Ambulatory Visit | Attending: Orthopedic Surgery | Admitting: Orthopedic Surgery

## 2019-10-01 ENCOUNTER — Other Ambulatory Visit: Payer: Self-pay | Admitting: Orthopedic Surgery

## 2019-10-01 ENCOUNTER — Other Ambulatory Visit (HOSPITAL_COMMUNITY): Payer: Self-pay | Admitting: Orthopedic Surgery

## 2019-10-01 ENCOUNTER — Other Ambulatory Visit: Payer: Self-pay

## 2019-10-01 DIAGNOSIS — M7989 Other specified soft tissue disorders: Secondary | ICD-10-CM

## 2019-10-01 DIAGNOSIS — R600 Localized edema: Secondary | ICD-10-CM | POA: Diagnosis not present

## 2019-11-04 DIAGNOSIS — M1712 Unilateral primary osteoarthritis, left knee: Secondary | ICD-10-CM | POA: Diagnosis not present

## 2019-11-04 DIAGNOSIS — Z96652 Presence of left artificial knee joint: Secondary | ICD-10-CM | POA: Diagnosis not present

## 2019-12-16 DIAGNOSIS — E7800 Pure hypercholesterolemia, unspecified: Secondary | ICD-10-CM | POA: Diagnosis not present

## 2019-12-16 DIAGNOSIS — Z79899 Other long term (current) drug therapy: Secondary | ICD-10-CM | POA: Diagnosis not present

## 2019-12-16 DIAGNOSIS — E119 Type 2 diabetes mellitus without complications: Secondary | ICD-10-CM | POA: Diagnosis not present

## 2019-12-22 DIAGNOSIS — Z79899 Other long term (current) drug therapy: Secondary | ICD-10-CM | POA: Diagnosis not present

## 2019-12-22 DIAGNOSIS — E039 Hypothyroidism, unspecified: Secondary | ICD-10-CM | POA: Diagnosis not present

## 2019-12-22 DIAGNOSIS — N183 Chronic kidney disease, stage 3 unspecified: Secondary | ICD-10-CM | POA: Diagnosis not present

## 2019-12-22 DIAGNOSIS — E119 Type 2 diabetes mellitus without complications: Secondary | ICD-10-CM | POA: Diagnosis not present

## 2019-12-22 DIAGNOSIS — E782 Mixed hyperlipidemia: Secondary | ICD-10-CM | POA: Diagnosis not present

## 2019-12-22 DIAGNOSIS — I700 Atherosclerosis of aorta: Secondary | ICD-10-CM | POA: Diagnosis not present

## 2019-12-22 DIAGNOSIS — E1122 Type 2 diabetes mellitus with diabetic chronic kidney disease: Secondary | ICD-10-CM | POA: Diagnosis not present

## 2019-12-22 DIAGNOSIS — I10 Essential (primary) hypertension: Secondary | ICD-10-CM | POA: Diagnosis not present

## 2020-01-21 DIAGNOSIS — H2513 Age-related nuclear cataract, bilateral: Secondary | ICD-10-CM | POA: Diagnosis not present

## 2020-03-23 DIAGNOSIS — M25561 Pain in right knee: Secondary | ICD-10-CM | POA: Diagnosis not present

## 2020-03-23 DIAGNOSIS — M7121 Synovial cyst of popliteal space [Baker], right knee: Secondary | ICD-10-CM | POA: Diagnosis not present

## 2020-03-23 DIAGNOSIS — M1711 Unilateral primary osteoarthritis, right knee: Secondary | ICD-10-CM | POA: Diagnosis not present

## 2020-03-30 DIAGNOSIS — Z96652 Presence of left artificial knee joint: Secondary | ICD-10-CM | POA: Diagnosis not present

## 2020-06-15 DIAGNOSIS — Z79899 Other long term (current) drug therapy: Secondary | ICD-10-CM | POA: Diagnosis not present

## 2020-06-15 DIAGNOSIS — E039 Hypothyroidism, unspecified: Secondary | ICD-10-CM | POA: Diagnosis not present

## 2020-06-15 DIAGNOSIS — E782 Mixed hyperlipidemia: Secondary | ICD-10-CM | POA: Diagnosis not present

## 2020-06-15 DIAGNOSIS — E119 Type 2 diabetes mellitus without complications: Secondary | ICD-10-CM | POA: Diagnosis not present

## 2020-06-22 DIAGNOSIS — E119 Type 2 diabetes mellitus without complications: Secondary | ICD-10-CM | POA: Diagnosis not present

## 2020-06-22 DIAGNOSIS — I10 Essential (primary) hypertension: Secondary | ICD-10-CM | POA: Diagnosis not present

## 2020-06-22 DIAGNOSIS — E039 Hypothyroidism, unspecified: Secondary | ICD-10-CM | POA: Diagnosis not present

## 2020-06-22 DIAGNOSIS — E1122 Type 2 diabetes mellitus with diabetic chronic kidney disease: Secondary | ICD-10-CM | POA: Diagnosis not present

## 2020-06-22 DIAGNOSIS — Z1331 Encounter for screening for depression: Secondary | ICD-10-CM | POA: Diagnosis not present

## 2020-06-22 DIAGNOSIS — N183 Chronic kidney disease, stage 3 unspecified: Secondary | ICD-10-CM | POA: Diagnosis not present

## 2020-06-22 DIAGNOSIS — E782 Mixed hyperlipidemia: Secondary | ICD-10-CM | POA: Diagnosis not present

## 2020-06-22 DIAGNOSIS — Z0000 Encounter for general adult medical examination without abnormal findings: Secondary | ICD-10-CM | POA: Diagnosis not present

## 2020-06-22 DIAGNOSIS — I700 Atherosclerosis of aorta: Secondary | ICD-10-CM | POA: Diagnosis not present

## 2020-09-10 DIAGNOSIS — Z96652 Presence of left artificial knee joint: Secondary | ICD-10-CM | POA: Diagnosis not present

## 2020-10-12 DIAGNOSIS — G3184 Mild cognitive impairment, so stated: Secondary | ICD-10-CM | POA: Diagnosis not present

## 2020-12-21 DIAGNOSIS — E782 Mixed hyperlipidemia: Secondary | ICD-10-CM | POA: Diagnosis not present

## 2020-12-21 DIAGNOSIS — E039 Hypothyroidism, unspecified: Secondary | ICD-10-CM | POA: Diagnosis not present

## 2020-12-21 DIAGNOSIS — E1122 Type 2 diabetes mellitus with diabetic chronic kidney disease: Secondary | ICD-10-CM | POA: Diagnosis not present

## 2020-12-21 DIAGNOSIS — N183 Chronic kidney disease, stage 3 unspecified: Secondary | ICD-10-CM | POA: Diagnosis not present

## 2020-12-28 DIAGNOSIS — E039 Hypothyroidism, unspecified: Secondary | ICD-10-CM | POA: Diagnosis not present

## 2020-12-28 DIAGNOSIS — I700 Atherosclerosis of aorta: Secondary | ICD-10-CM | POA: Diagnosis not present

## 2020-12-28 DIAGNOSIS — E7800 Pure hypercholesterolemia, unspecified: Secondary | ICD-10-CM | POA: Diagnosis not present

## 2020-12-28 DIAGNOSIS — E1122 Type 2 diabetes mellitus with diabetic chronic kidney disease: Secondary | ICD-10-CM | POA: Diagnosis not present

## 2020-12-28 DIAGNOSIS — N183 Chronic kidney disease, stage 3 unspecified: Secondary | ICD-10-CM | POA: Diagnosis not present

## 2020-12-28 DIAGNOSIS — G3184 Mild cognitive impairment, so stated: Secondary | ICD-10-CM | POA: Diagnosis not present

## 2020-12-28 DIAGNOSIS — Z79899 Other long term (current) drug therapy: Secondary | ICD-10-CM | POA: Diagnosis not present

## 2021-03-31 DIAGNOSIS — Z96652 Presence of left artificial knee joint: Secondary | ICD-10-CM | POA: Diagnosis not present

## 2021-05-11 DIAGNOSIS — H2513 Age-related nuclear cataract, bilateral: Secondary | ICD-10-CM | POA: Diagnosis not present

## 2021-05-11 DIAGNOSIS — H35372 Puckering of macula, left eye: Secondary | ICD-10-CM | POA: Diagnosis not present

## 2021-05-24 DIAGNOSIS — D1801 Hemangioma of skin and subcutaneous tissue: Secondary | ICD-10-CM | POA: Diagnosis not present

## 2021-05-24 DIAGNOSIS — L570 Actinic keratosis: Secondary | ICD-10-CM | POA: Diagnosis not present

## 2021-05-24 DIAGNOSIS — D235 Other benign neoplasm of skin of trunk: Secondary | ICD-10-CM | POA: Diagnosis not present

## 2021-05-24 DIAGNOSIS — L820 Inflamed seborrheic keratosis: Secondary | ICD-10-CM | POA: Diagnosis not present

## 2021-05-24 DIAGNOSIS — L821 Other seborrheic keratosis: Secondary | ICD-10-CM | POA: Diagnosis not present

## 2021-05-24 DIAGNOSIS — L814 Other melanin hyperpigmentation: Secondary | ICD-10-CM | POA: Diagnosis not present

## 2021-06-14 DIAGNOSIS — D485 Neoplasm of uncertain behavior of skin: Secondary | ICD-10-CM | POA: Diagnosis not present

## 2021-06-14 DIAGNOSIS — L820 Inflamed seborrheic keratosis: Secondary | ICD-10-CM | POA: Diagnosis not present

## 2021-06-23 DIAGNOSIS — M79672 Pain in left foot: Secondary | ICD-10-CM | POA: Diagnosis not present

## 2021-06-23 DIAGNOSIS — M722 Plantar fascial fibromatosis: Secondary | ICD-10-CM | POA: Diagnosis not present

## 2021-06-23 DIAGNOSIS — M2012 Hallux valgus (acquired), left foot: Secondary | ICD-10-CM | POA: Diagnosis not present

## 2021-06-23 DIAGNOSIS — M2011 Hallux valgus (acquired), right foot: Secondary | ICD-10-CM | POA: Diagnosis not present

## 2021-06-28 DIAGNOSIS — E7800 Pure hypercholesterolemia, unspecified: Secondary | ICD-10-CM | POA: Diagnosis not present

## 2021-06-28 DIAGNOSIS — Z79899 Other long term (current) drug therapy: Secondary | ICD-10-CM | POA: Diagnosis not present

## 2021-06-28 DIAGNOSIS — N183 Chronic kidney disease, stage 3 unspecified: Secondary | ICD-10-CM | POA: Diagnosis not present

## 2021-06-28 DIAGNOSIS — E039 Hypothyroidism, unspecified: Secondary | ICD-10-CM | POA: Diagnosis not present

## 2021-06-28 DIAGNOSIS — E1122 Type 2 diabetes mellitus with diabetic chronic kidney disease: Secondary | ICD-10-CM | POA: Diagnosis not present

## 2021-07-05 DIAGNOSIS — Z1331 Encounter for screening for depression: Secondary | ICD-10-CM | POA: Diagnosis not present

## 2021-07-05 DIAGNOSIS — I6523 Occlusion and stenosis of bilateral carotid arteries: Secondary | ICD-10-CM | POA: Diagnosis not present

## 2021-07-05 DIAGNOSIS — Z0000 Encounter for general adult medical examination without abnormal findings: Secondary | ICD-10-CM | POA: Diagnosis not present

## 2021-08-02 ENCOUNTER — Other Ambulatory Visit: Payer: Self-pay | Admitting: Neurology

## 2021-08-02 DIAGNOSIS — R413 Other amnesia: Secondary | ICD-10-CM

## 2021-08-08 ENCOUNTER — Ambulatory Visit (HOSPITAL_COMMUNITY)
Admission: RE | Admit: 2021-08-08 | Discharge: 2021-08-08 | Disposition: A | Discharge status: Home / Self Care | Payer: Medicare HMO | Source: Ambulatory Visit | Attending: Neurology | Admitting: Neurology

## 2021-08-08 ENCOUNTER — Other Ambulatory Visit: Payer: Self-pay

## 2021-08-08 DIAGNOSIS — R413 Other amnesia: Secondary | ICD-10-CM | POA: Insufficient documentation
